# Patient Record
Sex: Female | Born: 1950
Health system: Southern US, Community
[De-identification: ages and names within clinical notes are randomized; demographics above are authoritative.]

## PROBLEM LIST (undated history)

## (undated) DIAGNOSIS — Z8619 Personal history of other infectious and parasitic diseases: Secondary | ICD-10-CM

## (undated) DIAGNOSIS — E079 Disorder of thyroid, unspecified: Secondary | ICD-10-CM

## (undated) DIAGNOSIS — K219 Gastro-esophageal reflux disease without esophagitis: Secondary | ICD-10-CM

## (undated) DIAGNOSIS — G43909 Migraine, unspecified, not intractable, without status migrainosus: Secondary | ICD-10-CM

## (undated) DIAGNOSIS — N809 Endometriosis, unspecified: Secondary | ICD-10-CM

## (undated) HISTORY — DX: Personal history of other infectious and parasitic diseases: Z86.19

## (undated) HISTORY — DX: Gastro-esophageal reflux disease without esophagitis: K21.9

## (undated) HISTORY — DX: Endometriosis, unspecified: N80.9

## (undated) HISTORY — PX: TONSILLECTOMY: SUR1361

## (undated) HISTORY — PX: OTHER SURGICAL HISTORY: SHX169

## (undated) HISTORY — PX: ABDOMINAL HYSTERECTOMY: SHX81

## (undated) HISTORY — DX: Disorder of thyroid, unspecified: E07.9

## (undated) HISTORY — DX: Migraine, unspecified, not intractable, without status migrainosus: G43.909

---

## 2016-01-07 MED FILL — DULoxetine HCL 60 MG CPEP: 60 | 90 days supply | Qty: 180 | Fill #0

## 2016-02-27 MED FILL — TOPIRAMATE 100 MG TABLET: 100 | 90 days supply | Qty: 90 | Fill #0

## 2016-03-01 MED FILL — ESTROGEN-METHYLTESTOS F.S.: 1.25-2.5 | 90 days supply | Qty: 90 | Fill #0

## 2016-03-01 MED FILL — BUTALB-ACETAMIN-CAFF 50-325: 50-325-40 | 15 days supply | Qty: 180 | Fill #0

## 2016-03-02 DIAGNOSIS — Z0000 Encounter for general adult medical examination without abnormal findings: Secondary | ICD-10-CM | POA: Diagnosis not present

## 2016-03-02 DIAGNOSIS — E559 Vitamin D deficiency, unspecified: Secondary | ICD-10-CM | POA: Diagnosis not present

## 2016-03-02 DIAGNOSIS — F419 Anxiety disorder, unspecified: Secondary | ICD-10-CM | POA: Diagnosis not present

## 2016-03-02 DIAGNOSIS — N951 Menopausal and female climacteric states: Secondary | ICD-10-CM | POA: Diagnosis not present

## 2016-03-02 DIAGNOSIS — K219 Gastro-esophageal reflux disease without esophagitis: Secondary | ICD-10-CM | POA: Diagnosis not present

## 2016-03-02 DIAGNOSIS — G4700 Insomnia, unspecified: Secondary | ICD-10-CM | POA: Diagnosis not present

## 2016-03-02 DIAGNOSIS — E039 Hypothyroidism, unspecified: Secondary | ICD-10-CM | POA: Diagnosis not present

## 2016-03-02 DIAGNOSIS — E785 Hyperlipidemia, unspecified: Secondary | ICD-10-CM | POA: Diagnosis not present

## 2016-03-02 DIAGNOSIS — G43909 Migraine, unspecified, not intractable, without status migrainosus: Secondary | ICD-10-CM | POA: Diagnosis not present

## 2016-03-05 MED FILL — PHENTERMINE 37.5 MG TABLET: 37.5 | 30 days supply | Qty: 30 | Fill #0

## 2016-03-05 MED FILL — NAPROXEN 500 MG TABLET: 500 | 90 days supply | Qty: 180 | Fill #0

## 2016-03-05 MED FILL — SYNTHROID 50 MCG TABLET: 50 | 90 days supply | Qty: 90 | Fill #0

## 2016-03-05 MED FILL — CLINDAMYCIN PH 1% GEL: 1 | 30 days supply | Qty: 60 | Fill #0

## 2016-03-05 MED FILL — traMADol HCL 50 MG TABS: 50 | 20 days supply | Qty: 60 | Fill #0

## 2016-04-05 MED FILL — PHENTERMINE 37.5 MG TABLET: 37.5 | 30 days supply | Qty: 30 | Fill #1

## 2016-04-05 MED FILL — DULoxetine HCL 60 MG CPEP: 60 | 90 days supply | Qty: 180 | Fill #0

## 2016-05-11 MED FILL — ESTROGEN-METHYLTESTOS F.S.: 1.25-2.5 | 90 days supply | Qty: 90 | Fill #1

## 2016-05-11 MED FILL — PHENTERMINE 37.5 MG TABLET: 37.5 | 30 days supply | Qty: 30 | Fill #2

## 2016-05-17 MED FILL — TOPIRAMATE 100 MG TABLET: 100 | 90 days supply | Qty: 90 | Fill #1

## 2016-07-02 MED FILL — traMADol HCL 50 MG TABS: 50 | 20 days supply | Qty: 60 | Fill #1

## 2016-07-02 MED FILL — PHENTERMINE 37.5 MG TABLET: 37.5 | 30 days supply | Qty: 30 | Fill #3

## 2016-07-09 MED FILL — DULoxetine HCL 60 MG CPEP: 60 | 90 days supply | Qty: 180 | Fill #1

## 2016-07-09 MED FILL — NAPROXEN 500 MG TABLET: 500 | 90 days supply | Qty: 180 | Fill #1

## 2016-07-21 ENCOUNTER — Other Ambulatory Visit (HOSPITAL_COMMUNITY): Payer: Self-pay | Admitting: Internal Medicine

## 2016-07-21 DIAGNOSIS — G43009 Migraine without aura, not intractable, without status migrainosus: Secondary | ICD-10-CM

## 2016-07-23 ENCOUNTER — Ambulatory Visit (HOSPITAL_COMMUNITY)
Admission: RE | Admit: 2016-07-23 | Discharge: 2016-07-23 | Disposition: A | Discharge status: Home / Self Care | Payer: 59 | Source: Ambulatory Visit | Attending: Internal Medicine | Admitting: Internal Medicine

## 2016-07-23 DIAGNOSIS — G43009 Migraine without aura, not intractable, without status migrainosus: Secondary | ICD-10-CM | POA: Insufficient documentation

## 2016-07-23 DIAGNOSIS — G43909 Migraine, unspecified, not intractable, without status migrainosus: Secondary | ICD-10-CM | POA: Diagnosis not present

## 2016-08-30 MED FILL — TOPIRAMATE 100 MG TABLET: 100 | 90 days supply | Qty: 90 | Fill #2

## 2016-09-08 MED FILL — ESTROGEN-METHYLTESTOS F.S.: 1.25-2.5 | 90 days supply | Qty: 90 | Fill #0

## 2016-09-24 MED FILL — SYNTHROID 50 MCG TABLET: 50 | 90 days supply | Qty: 90 | Fill #1

## 2016-09-27 MED FILL — PHENTERMINE 37.5 MG TABLET: 37.5 | 30 days supply | Qty: 30 | Fill #0

## 2016-11-09 DIAGNOSIS — S61452A Open bite of left hand, initial encounter: Secondary | ICD-10-CM | POA: Diagnosis not present

## 2016-11-16 MED FILL — PHENTERMINE 37.5 MG TABLET: 37.5 | 30 days supply | Qty: 30 | Fill #1

## 2016-12-02 MED FILL — DULoxetine HCL 60 MG CPEP: 60 | 90 days supply | Qty: 180 | Fill #2

## 2016-12-24 MED FILL — TOPIRAMATE 100 MG TABLET: 100 | 90 days supply | Qty: 90 | Fill #3

## 2016-12-24 MED FILL — PHENTERMINE 37.5 MG TABLET: 37.5 | 30 days supply | Qty: 30 | Fill #2

## 2016-12-24 MED FILL — ESTROGEN-METHYLTESTOS F.S.: 1.25-2.5 | 90 days supply | Qty: 90 | Fill #1

## 2017-01-12 DIAGNOSIS — L570 Actinic keratosis: Secondary | ICD-10-CM | POA: Diagnosis not present

## 2017-01-12 DIAGNOSIS — D485 Neoplasm of uncertain behavior of skin: Secondary | ICD-10-CM | POA: Diagnosis not present

## 2017-01-20 DIAGNOSIS — Z1231 Encounter for screening mammogram for malignant neoplasm of breast: Secondary | ICD-10-CM | POA: Diagnosis not present

## 2017-01-25 DIAGNOSIS — L820 Inflamed seborrheic keratosis: Secondary | ICD-10-CM | POA: Diagnosis not present

## 2017-01-25 DIAGNOSIS — L570 Actinic keratosis: Secondary | ICD-10-CM | POA: Diagnosis not present

## 2017-03-10 DIAGNOSIS — J019 Acute sinusitis, unspecified: Secondary | ICD-10-CM | POA: Diagnosis not present

## 2017-03-10 DIAGNOSIS — E039 Hypothyroidism, unspecified: Secondary | ICD-10-CM | POA: Diagnosis not present

## 2017-03-10 DIAGNOSIS — E785 Hyperlipidemia, unspecified: Secondary | ICD-10-CM | POA: Diagnosis not present

## 2017-03-10 DIAGNOSIS — F419 Anxiety disorder, unspecified: Secondary | ICD-10-CM | POA: Diagnosis not present

## 2017-03-10 DIAGNOSIS — E559 Vitamin D deficiency, unspecified: Secondary | ICD-10-CM | POA: Diagnosis not present

## 2017-03-10 DIAGNOSIS — R030 Elevated blood-pressure reading, without diagnosis of hypertension: Secondary | ICD-10-CM | POA: Diagnosis not present

## 2017-03-10 MED FILL — AZITHROMYCIN 250 MG TABLET: 250 | 5 days supply | Qty: 6 | Fill #0

## 2017-03-10 MED FILL — CYCLOBENZAPRINE 5 MG TABLET: 5 | 20 days supply | Qty: 60 | Fill #0

## 2017-03-11 MED FILL — traMADol HCL 50 MG TABS: 50 | 20 days supply | Qty: 60 | Fill #0

## 2017-03-28 MED FILL — PHENTERMINE 37.5 MG TABLET: 37.5 | 30 days supply | Qty: 30 | Fill #0 | Status: TO

## 2017-03-28 MED FILL — TROKENDI XR 100 MG CAPSULE: 100 | 30 days supply | Qty: 30 | Fill #0 | Status: TO

## 2017-04-13 DIAGNOSIS — K219 Gastro-esophageal reflux disease without esophagitis: Secondary | ICD-10-CM | POA: Diagnosis not present

## 2017-04-13 DIAGNOSIS — Z1211 Encounter for screening for malignant neoplasm of colon: Secondary | ICD-10-CM | POA: Diagnosis not present

## 2017-04-13 DIAGNOSIS — Z1212 Encounter for screening for malignant neoplasm of rectum: Secondary | ICD-10-CM | POA: Diagnosis not present

## 2017-04-13 MED FILL — SYNTHROID 50 MCG TABLET: 50 | 90 days supply | Qty: 90 | Fill #0

## 2017-04-13 MED FILL — LANSOPRAZOLE DR 30 MG CAP: 30 | 30 days supply | Qty: 60 | Fill #0

## 2017-04-14 MED FILL — ESTROGEN-METHYLTESTOS F.S.: 1.25-2.5 | 90 days supply | Qty: 90 | Fill #0

## 2017-05-09 MED FILL — PHENTERMINE 37.5 MG TABLET: 37.5 | 30 days supply | Qty: 30 | Fill #0

## 2017-05-09 MED FILL — TROKENDI XR 100 MG CAPSULE: 100 | 30 days supply | Qty: 30 | Fill #0

## 2017-05-11 MED FILL — SUPREP BOWEL PREP KIT: 17.5-3.13-1 | 1 days supply | Qty: 354 | Fill #0

## 2017-05-17 DIAGNOSIS — Z8371 Family history of colonic polyps: Secondary | ICD-10-CM | POA: Diagnosis not present

## 2017-05-17 DIAGNOSIS — D120 Benign neoplasm of cecum: Secondary | ICD-10-CM | POA: Diagnosis not present

## 2017-05-17 DIAGNOSIS — Z1211 Encounter for screening for malignant neoplasm of colon: Secondary | ICD-10-CM | POA: Diagnosis not present

## 2017-05-17 LAB — HM COLONOSCOPY

## 2017-06-09 MED FILL — TROKENDI XR 100 MG CAPSULE: 100 | 30 days supply | Qty: 30 | Fill #1

## 2017-06-09 MED FILL — LANSOPRAZOLE DR 30 MG CAP: 30 | 30 days supply | Qty: 60 | Fill #1

## 2017-06-09 MED FILL — PHENTERMINE 37.5 MG TABLET: 37.5 | 30 days supply | Qty: 30 | Fill #1

## 2017-06-30 MED FILL — DULoxetine HCL 60 MG CPEP: 60 | 90 days supply | Qty: 180 | Fill #0

## 2017-07-29 MED FILL — PHENTERMINE 37.5 MG TABLET: 37.5 | 30 days supply | Qty: 30 | Fill #2

## 2017-07-29 MED FILL — ESTROGEN-METHYLTESTOS F.S.: 1.25-2.5 | 90 days supply | Qty: 90 | Fill #1

## 2017-07-29 MED FILL — TROKENDI XR 100 MG CAPSULE: 100 | 30 days supply | Qty: 30 | Fill #2

## 2017-08-12 MED FILL — LANSOPRAZOLE DR 30 MG CAP: 30 | 30 days supply | Qty: 60 | Fill #2 | Status: TO

## 2017-08-24 DIAGNOSIS — Z0000 Encounter for general adult medical examination without abnormal findings: Secondary | ICD-10-CM | POA: Diagnosis not present

## 2017-08-24 MED FILL — ONDANSETRON ODT 4 MG TABLET: 4 | 20 days supply | Qty: 40 | Fill #0

## 2017-08-24 MED FILL — TROKENDI XR 100 MG CAPSULE: 100 | 90 days supply | Qty: 90 | Fill #0

## 2017-08-24 MED FILL — MONTELUKAST SOD 10 MG TAB: 10 | 90 days supply | Qty: 90 | Fill #0

## 2017-08-25 MED FILL — traMADol HCL 50 MG TABS: 50 | 30 days supply | Qty: 90 | Fill #0

## 2017-08-26 MED FILL — SYNTHROID 50 MCG TABLET: 50 | 90 days supply | Qty: 90 | Fill #0

## 2017-09-05 MED FILL — LANSOPRAZOLE DR 30 MG CAP: 30 | 90 days supply | Qty: 180 | Fill #0

## 2017-10-26 MED FILL — PHENTERMINE 37.5 MG TABLET: 37.5 | 30 days supply | Qty: 30 | Fill #0

## 2017-10-26 MED FILL — SHIPPING COST: 1 days supply | Qty: 1 | Fill #0

## 2017-11-29 ENCOUNTER — Other Ambulatory Visit (INDEPENDENT_AMBULATORY_CARE_PROVIDER_SITE_OTHER): Payer: 59

## 2017-11-29 ENCOUNTER — Ambulatory Visit: Payer: 59 | Admitting: Nurse Practitioner

## 2017-11-29 ENCOUNTER — Encounter: Payer: Self-pay | Admitting: Nurse Practitioner

## 2017-11-29 VITALS — BP 138/82 | HR 67 | Temp 98.0°F | Resp 16 | Ht 63.75 in | Wt 139.8 lb

## 2017-11-29 DIAGNOSIS — F419 Anxiety disorder, unspecified: Secondary | ICD-10-CM

## 2017-11-29 DIAGNOSIS — R110 Nausea: Secondary | ICD-10-CM

## 2017-11-29 DIAGNOSIS — M25519 Pain in unspecified shoulder: Secondary | ICD-10-CM

## 2017-11-29 DIAGNOSIS — K219 Gastro-esophageal reflux disease without esophagitis: Secondary | ICD-10-CM

## 2017-11-29 DIAGNOSIS — G43809 Other migraine, not intractable, without status migrainosus: Secondary | ICD-10-CM | POA: Diagnosis not present

## 2017-11-29 DIAGNOSIS — H524 Presbyopia: Secondary | ICD-10-CM | POA: Diagnosis not present

## 2017-11-29 DIAGNOSIS — E039 Hypothyroidism, unspecified: Secondary | ICD-10-CM

## 2017-11-29 DIAGNOSIS — Z780 Asymptomatic menopausal state: Secondary | ICD-10-CM | POA: Diagnosis not present

## 2017-11-29 DIAGNOSIS — G8929 Other chronic pain: Secondary | ICD-10-CM | POA: Diagnosis not present

## 2017-11-29 LAB — COMPREHENSIVE METABOLIC PANEL
ALT: 22 U/L (ref 0–35)
AST: 25 U/L (ref 0–37)
Albumin: 4.1 g/dL (ref 3.5–5.2)
Alkaline Phosphatase: 51 U/L (ref 39–117)
BUN: 17 mg/dL (ref 6–23)
CALCIUM: 9.5 mg/dL (ref 8.4–10.5)
CHLORIDE: 109 meq/L (ref 96–112)
CO2: 26 mEq/L (ref 19–32)
Creatinine, Ser: 0.75 mg/dL (ref 0.40–1.20)
GFR: 82.33 mL/min (ref >60.00)
Glucose, Bld: 92 mg/dL (ref 70–99)
POTASSIUM: 4.1 meq/L (ref 3.5–5.1)
Sodium: 140 mEq/L (ref 135–145)
Total Bilirubin: 0.4 mg/dL (ref 0.2–1.2)
Total Protein: 6.6 g/dL (ref 6.0–8.3)

## 2017-11-29 LAB — CBC
HEMATOCRIT: 41 % (ref 36.0–46.0)
HEMOGLOBIN: 13.7 g/dL (ref 12.0–15.0)
MCHC: 33.4 g/dL (ref 30.0–36.0)
MCV: 91.7 fl (ref 78.0–100.0)
PLATELETS: 187 10*3/uL (ref 150.0–400.0)
RBC: 4.47 Mil/uL (ref 3.87–5.11)
RDW: 14.1 % (ref 11.5–15.5)
WBC: 3.6 10*3/uL — ABNORMAL LOW (ref 4.0–10.5)

## 2017-11-29 LAB — VITAMIN B12: Vitamin B-12: 341 pg/mL (ref 211–911)

## 2017-11-29 LAB — LIPID PANEL
CHOL/HDL RATIO: 3
Cholesterol: 189 mg/dL (ref 0–200)
HDL: 57.7 mg/dL (ref >39.00)
LDL CALC: 125 mg/dL — AB (ref 0–99)
NONHDL: 130.86
TRIGLYCERIDES: 31 mg/dL (ref 0.0–149.0)
VLDL: 6.2 mg/dL (ref 0.0–40.0)

## 2017-11-29 LAB — TSH: TSH: 1.65 u[IU]/mL (ref 0.35–4.50)

## 2017-11-29 LAB — MAGNESIUM: Magnesium: 2.2 mg/dL (ref 1.5–2.5)

## 2017-11-29 LAB — HEMOGLOBIN A1C: HEMOGLOBIN A1C: 5.9 % (ref 4.6–6.5)

## 2017-11-29 NOTE — Patient Instructions (Addendum)
Please head downstairs for lab work/x-rays. If any of your test results are critically abnormal, you will be contacted right away. Your results may be released to your MyChart for viewing before I am able to provide you with my response. I will contact you within a week about your test results and any recommendations for abnormalities.  I have placed a referral to neurology . Our office will call you to schedule this appointment. You should hear from our office in 7-10 days.  Please return in about 1 months for annual physical.

## 2017-11-29 NOTE — Progress Notes (Signed)
Name: Brooke Stuart   MRN: [id number]    DOB: 1950-07-12   Date:11/30/2017       Progress Note  Subjective  Chief Complaint  Chief Complaint  Patient presents with  . Establish Care    nausea and migraines    HPI Brooke Stuart is coming in to establish care with me as a new patient to our practice today. She has been working with a PCP in [place], where she lives, but works in [place] so decided to transfer care here. She is requesting evaluation of dizziness, nausea, and migraines today. We will review her chronic conditions as well.  Migraines- Long history of migraines since 2005 Maintained on trokendi daily at bedtime and excedrin migraine, essential oils for acute migraine, which occurs about 3 times per month and does seem to be relieved with excedrin and essential oils. She often experiences nausea with migraines, but she has also noticed feeling nauseated and dizzy, "feeling off balance" when she is not having a migraine headache. These symptoms have been occurring intermittently for years now She was given zofran prn by prior PCP which helps her nausea. She denies fevers, syncope, weakness, confusion, chest pain, abdominal pain, vomiting, shortness of breath, bowel or bladder changes. She originally saw neurology for evaluation in 2005 when she first started having the migraines, and has had an MRI every 4-5 years since, last MRI was 07/23/2016 Available to view in [place] was normal for age.  Menopausal symptoms- Maintained on estratest for years for vasomotor symptoms associated with menopause- tried to stop taking in 2014 but had continued symptoms and had to resume medication daily Denies pelvic pain, vaginal bleeding S/p complete hysterectomy Mammogram is overdue, she has not called to schedule  Anxiety- maintained on cymbalta 60 BID Reports good control of anxiety on current dosage of cymbalta and would like to continue this medication Denies depression, thoughts of  hurting herself or others  GERD- Maintained on prevacid 30 bid with good control of symptoms and no noted adverse effects Reports breakthrough symptoms- acid reflux, heartburn, if she misses doses  Hypothryoidism-maintained on synthroid 50 daily Reports daily medication compliance with no noted adverse effects Denies fatigue, weight gain, decreased appetite.  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 Menopause 11/30/2017  . Gastroesophageal reflux disease 11/30/2017  . Hypothyroidism 11/30/2017  . Migraine 11/30/2017  . Anxiety 11/30/2017  . Chronic shoulder pain 11/30/2017    Past Surgical History:  Procedure Laterality Date  . ABDOMINAL HYSTERECTOMY      Family History  Problem Relation Age of Onset  . Arthritis Mother   . Diabetes Mother   . Hearing loss Mother   . Heart disease Mother   . Stroke Father   . Alcohol abuse Father   . Hypertension Brother     Social History   Socioeconomic History  . Marital status: Married    Spouse name: Not on file  . Number of children: Not on file  . Years of education: Not on file  . Highest education level: Not on file  Occupational History  . Not  on file  Social Needs  . Financial resource strain: Not on file  . Food insecurity:    Worry: Not on file    Inability: Not on file  . Transportation needs:    Medical: Not on file    Non-medical: Not on file  Tobacco Use  . Smoking status: Never Smoker  . Smokeless tobacco: Never Used  Substance and Sexual Activity  . Alcohol use: Not Currently  . Drug use: Not  Currently  . Sexual activity: Yes  Lifestyle  . Physical activity:    Days per week: Not on file    Minutes per session: Not on file  . Stress: Not on file  Relationships  . Social connections:    Talks on phone: Not on file    Gets together: Not on file    Attends religious service: Not on file    Active member of club or organization: Not on file    Attends meetings of clubs or organizations: Not on file    Relationship status: Not on file  . Intimate partner violence:    Fear of current or ex partner: Not on file    Emotionally abused: Not on file    Physically abused: Not on file    Forced sexual activity: Not on file  Other Topics Concern  . Not on file  Social History Narrative  . Not on file     Current Outpatient Medications:  .  DULoxetine (CYMBALTA) 60 MG capsule, Take 60 mg by mouth 2 (two) times daily., Disp: , Rfl:  .  EST ESTROGENS-METHYLTEST DS 1.25-2.5 MG TABS, Take by mouth., Disp: , Rfl:  .  lansoprazole (PREVACID) 30 MG capsule, Take 30 mg by mouth 2 (two) times daily before a meal., Disp: , Rfl:  .  levothyroxine (SYNTHROID, LEVOTHROID) 50 MCG tablet, Take 50 mcg by mouth daily before breakfast., Disp: , Rfl:  .  montelukast (SINGULAIR) 10 MG tablet, Take 10 mg by mouth at bedtime., Disp: , Rfl:  .  naproxen (NAPROSYN) 500 MG tablet, Take 500 mg by mouth as needed., Disp: , Rfl:  .  Topiramate ER (TROKENDI XR) 100 MG CP24, Take by mouth., Disp: , Rfl:  .  traMADol (ULTRAM) 50 MG tablet, Take 50 mg by mouth as needed., Disp: , Rfl:  .  TURMERIC PO, Take 1,000 mg by mouth., Disp: , Rfl:   No Known Allergies   ROS See HPI  Objective  Vitals:   11/29/17 0852  BP: 138/82  Pulse: 67  Resp: 16  Temp: 98 F (36.7 C)  TempSrc: Oral  SpO2: 97%  Weight: 139 lb 12.8 oz (63.4 kg)  Height: 5' 3.75" (1.619 m)    Body mass index is 24.19 kg/m.  Physical Exam Vital signs reviewed. Constitutional: Patient appears well-developed and well-nourished. No  distress.  HENT: Head: Normocephalic and atraumatic.  Nose: Nose normal. Mouth/Throat: Oropharynx is clear and moist. No oropharyngeal exudate.  Eyes: Conjunctivae and EOM are normal. Pupils are equal, round, and reactive to light. No scleral icterus.  Neck: Normal range of motion. Neck supple. No thyromegaly present. No cervical adenopathy. Cardiovascular: Normal rate, regular rhythm and normal heart sounds.  No murmur heard. No BLE edema. Distal pulses intact. Pulmonary/Chest: Effort normal and breath sounds normal. No respiratory distress. Abdominal: Soft, no distension. Musculoskeletal: Normal range of motion. No gross deformities Neurological: She is alert and oriented to person, place, and time. No cranial nerve deficit. Coordination, balance, strength, speech and gait are  normal.  Skin: Skin is warm and dry. No rash noted. No erythema.  Psychiatric: Patient has a normal mood and affect. behavior is normal. Judgment and thought content normal.  Assessment & Plan RTC in 1 month for CPE  Nausea Likely related to migraines, referral to neurology has been made Update labs today F/u for new, worsening symptoms or if nausea persists after neurology evaluation, could consider adjusting GERD treatment if nausea remains - CBC; Future - Comprehensive metabolic panel; Future - Vitamin B12; Future - TSH; Future - Hemoglobin A1c; Future - Ambulatory referral to Neurology

## 2017-11-30 ENCOUNTER — Encounter: Payer: Self-pay | Admitting: Nurse Practitioner

## 2017-11-30 DIAGNOSIS — G8929 Other chronic pain: Secondary | ICD-10-CM | POA: Insufficient documentation

## 2017-11-30 DIAGNOSIS — E039 Hypothyroidism, unspecified: Secondary | ICD-10-CM | POA: Insufficient documentation

## 2017-11-30 DIAGNOSIS — Z780 Asymptomatic menopausal state: Secondary | ICD-10-CM | POA: Insufficient documentation

## 2017-11-30 DIAGNOSIS — R110 Nausea: Secondary | ICD-10-CM | POA: Insufficient documentation

## 2017-11-30 DIAGNOSIS — M25519 Pain in unspecified shoulder: Secondary | ICD-10-CM

## 2017-11-30 DIAGNOSIS — F419 Anxiety disorder, unspecified: Secondary | ICD-10-CM | POA: Insufficient documentation

## 2017-11-30 DIAGNOSIS — G43909 Migraine, unspecified, not intractable, without status migrainosus: Secondary | ICD-10-CM | POA: Insufficient documentation

## 2017-11-30 DIAGNOSIS — K219 Gastro-esophageal reflux disease without esophagitis: Secondary | ICD-10-CM | POA: Insufficient documentation

## 2017-11-30 NOTE — Assessment & Plan Note (Signed)
Stable Continue current meds on prn basis only Follow up for new, worsening symptoms - traMADol (ULTRAM) 50 MG tablet; Take 50 mg by mouth as needed. - naproxen (NAPROSYN) 500 MG tablet; Take 500 mg by mouth as needed.

## 2017-11-30 NOTE — Assessment & Plan Note (Signed)
Continue current medications Update labs - Lipid panel; Future - TSH; Future

## 2017-11-30 NOTE — Assessment & Plan Note (Signed)
Continue current medications We discussed referral to neurology for further evaluation and management and to consider newer treatments available for migraines and she is agreeable Update labs today - CBC; Future - Comprehensive metabolic panel; Future - Vitamin B12; Future - Hemoglobin A1c; Future - Ambulatory referral to Neurology - Topiramate ER (TROKENDI XR) 100 MG CP24; Take by mouth.

## 2017-11-30 NOTE — Assessment & Plan Note (Signed)
Stable Continue cymbalta Update labs F/u for new, worsening symptoms - DULoxetine (CYMBALTA) 60 MG capsule; Take 60 mg by mouth 2 (two) times daily. - Comprehensive metabolic panel; Future

## 2017-11-30 NOTE — Assessment & Plan Note (Addendum)
Stable Continue current medication Update labs F/U for new, worsening symptoms - Magnesium; Future - lansoprazole (PREVACID) 30 MG capsule; Take 30 mg by mouth 2 (two) times daily before a meal.

## 2017-11-30 NOTE — Assessment & Plan Note (Signed)
Strongly encouraged to update mammogram, she says she wants to have her mammogram at [facility] facility and she will call them to schedule Update labs Will consider weaning or decreasing dosage of estratest at next OV - EST ESTROGENS-METHYLTEST DS 1.25-2.5 MG TABS; Take by mouth. - Comprehensive metabolic panel; Future - TSH; Future - Lipid panel; Future

## 2017-12-27 ENCOUNTER — Ambulatory Visit (INDEPENDENT_AMBULATORY_CARE_PROVIDER_SITE_OTHER): Payer: 59 | Admitting: Nurse Practitioner

## 2017-12-27 ENCOUNTER — Telehealth: Payer: Self-pay

## 2017-12-27 ENCOUNTER — Encounter: Payer: Self-pay | Admitting: Nurse Practitioner

## 2017-12-27 ENCOUNTER — Ambulatory Visit (INDEPENDENT_AMBULATORY_CARE_PROVIDER_SITE_OTHER)
Admission: RE | Admit: 2017-12-27 | Discharge: 2017-12-27 | Disposition: A | Discharge status: Home / Self Care | Payer: 59 | Source: Ambulatory Visit | Attending: Nurse Practitioner | Admitting: Nurse Practitioner

## 2017-12-27 ENCOUNTER — Other Ambulatory Visit: Payer: 59

## 2017-12-27 VITALS — BP 130/84 | HR 61 | Temp 98.4°F | Resp 16 | Ht 63.75 in | Wt 140.0 lb

## 2017-12-27 DIAGNOSIS — F419 Anxiety disorder, unspecified: Secondary | ICD-10-CM | POA: Diagnosis not present

## 2017-12-27 DIAGNOSIS — Z13820 Encounter for screening for osteoporosis: Secondary | ICD-10-CM

## 2017-12-27 DIAGNOSIS — Z780 Asymptomatic menopausal state: Secondary | ICD-10-CM

## 2017-12-27 DIAGNOSIS — Z1159 Encounter for screening for other viral diseases: Secondary | ICD-10-CM

## 2017-12-27 DIAGNOSIS — Z9189 Other specified personal risk factors, not elsewhere classified: Secondary | ICD-10-CM

## 2017-12-27 DIAGNOSIS — IMO0002 Reserved for concepts with insufficient information to code with codable children: Secondary | ICD-10-CM

## 2017-12-27 DIAGNOSIS — Z0001 Encounter for general adult medical examination with abnormal findings: Secondary | ICD-10-CM

## 2017-12-27 DIAGNOSIS — Z114 Encounter for screening for human immunodeficiency virus [HIV]: Secondary | ICD-10-CM

## 2017-12-27 MED FILL — PREDNISOLONE AC 1% EYE DROP: 1 | 30 days supply | Qty: 10 | Fill #0

## 2017-12-27 MED FILL — MOXIFLOXACIN HCL 0.5 % SOLN: 0.5 | 15 days supply | Qty: 3 | Fill #0

## 2017-12-27 MED FILL — NEO/POLY/DEXAMET EYE OINT: 3.5-10000-0 | 14 days supply | Qty: 4 | Fill #0

## 2017-12-27 NOTE — Patient Instructions (Signed)
Please head downstairs for lab work If any of your test results are critically abnormal, you will be contacted right away. Otherwise, I will contact you within a week about your test results and any recommendations for abnormalities.  Please schedule DEXA scan at the front desk on your way out.  I have placed a referral to podiatry. Our office will begin processing this referral. Please follow up if you have not heard anything about this referral within 10 days.   Please return in about 6 months for follow up of thyroid and prediabetes.  Health Maintenance, Female Adopting a healthy lifestyle and getting preventive care can go a long way to promote health and wellness. Talk with your health care provider about what schedule of regular examinations is right for you. This is a good chance for you to check in with your provider about disease prevention and staying healthy. In between checkups, there are plenty of things you can do on your own. Experts have done a lot of research about which lifestyle changes and preventive measures are most likely to keep you healthy. Ask your health care provider for more information. Weight and diet Eat a healthy diet  Be sure to include plenty of vegetables, fruits, low-fat dairy products, and lean protein.  Do not eat a lot of foods high in solid fats, added sugars, or salt.  Get regular exercise. This is one of the most important things you can do for your health. ? Most adults should exercise for at least 150 minutes each week. The exercise should increase your heart rate and make you sweat (moderate-intensity exercise). ? Most adults should also do strengthening exercises at least twice a week. This is in addition to the moderate-intensity exercise.  Maintain a healthy weight  Body mass index (BMI) is a measurement that can be used to identify possible weight problems. It estimates body fat based on height and weight. Your health care provider can help  determine your BMI and help you achieve or maintain a healthy weight.  For females 79 years of age and older: ? A BMI below 18.5 is considered underweight. ? A BMI of 18.5 to 24.9 is normal. ? A BMI of 25 to 29.9 is considered overweight. ? A BMI of 30 and above is considered obese.  Watch levels of cholesterol and blood lipids  You should start having your blood tested for lipids and cholesterol at 65 years of age, then have this test every 5 years.  You may need to have your cholesterol levels checked more often if: ? Your lipid or cholesterol levels are high. ? You are older than 65 years of age. ? You are at high risk for heart disease.  Cancer screening Lung Cancer  Lung cancer screening is recommended for adults 28-46 years old who are at high risk for lung cancer because of a history of smoking.  A yearly low-dose CT scan of the lungs is recommended for people who: ? Currently smoke. ? Have quit within the past 15 years. ? Have at least a 30-pack-year history of smoking. A pack year is smoking an average of one pack of cigarettes a day for 1 year.  Yearly screening should continue until it has been 15 years since you quit.  Yearly screening should stop if you develop a health problem that would prevent you from having lung cancer treatment.  Breast Cancer  Practice breast self-awareness. This means understanding how your breasts normally appear and feel.  It also  means doing regular breast self-exams. Let your health care provider know about any changes, no matter how small.  If you are in your 20s or 30s, you should have a clinical breast exam (CBE) by a health care provider every 1-3 years as part of a regular health exam.  If you are 55 or older, have a CBE every year. Also consider having a breast X-ray (mammogram) every year.  If you have a family history of breast cancer, talk to your health care provider about genetic screening.  If you are at high risk for  breast cancer, talk to your health care provider about having an MRI and a mammogram every year.  Breast cancer gene (BRCA) assessment is recommended for women who have family members with BRCA-related cancers. BRCA-related cancers include: ? Breast. ? Ovarian. ? Tubal. ? Peritoneal cancers.  Results of the assessment will determine the need for genetic counseling and BRCA1 and BRCA2 testing.  Cervical Cancer Your health care provider may recommend that you be screened regularly for cancer of the pelvic organs (ovaries, uterus, and vagina). This screening involves a pelvic examination, including checking for microscopic changes to the surface of your cervix (Pap test). You may be encouraged to have this screening done every 3 years, beginning at age 30.  For women ages 59-65, health care providers may recommend pelvic exams and Pap testing every 3 years, or they may recommend the Pap and pelvic exam, combined with testing for human papilloma virus (HPV), every 5 years. Some types of HPV increase your risk of cervical cancer. Testing for HPV may also be done on women of any age with unclear Pap test results.  Other health care providers may not recommend any screening for nonpregnant women who are considered low risk for pelvic cancer and who do not have symptoms. Ask your health care provider if a screening pelvic exam is right for you.  If you have had past treatment for cervical cancer or a condition that could lead to cancer, you need Pap tests and screening for cancer for at least 20 years after your treatment. If Pap tests have been discontinued, your risk factors (such as having a new sexual partner) need to be reassessed to determine if screening should resume. Some women have medical problems that increase the chance of getting cervical cancer. In these cases, your health care provider may recommend more frequent screening and Pap tests.  Colorectal Cancer  This type of cancer can be  detected and often prevented.  Routine colorectal cancer screening usually begins at 65 years of age and continues through 65 years of age.  Your health care provider may recommend screening at an earlier age if you have risk factors for colon cancer.  Your health care provider may also recommend using home test kits to check for hidden blood in the stool.  A small camera at the end of a tube can be used to examine your colon directly (sigmoidoscopy or colonoscopy). This is done to check for the earliest forms of colorectal cancer.  Routine screening usually begins at age 64.  Direct examination of the colon should be repeated every 5-10 years through 65 years of age. However, you may need to be screened more often if early forms of precancerous polyps or small growths are found.  Skin Cancer  Check your skin from head to toe regularly.  Tell your health care provider about any new moles or changes in moles, especially if there is a change in  a mole's shape or color.  Also tell your health care provider if you have a mole that is larger than the size of a pencil eraser.  Always use sunscreen. Apply sunscreen liberally and repeatedly throughout the day.  Protect yourself by wearing long sleeves, pants, a wide-brimmed hat, and sunglasses whenever you are outside.  Heart disease, diabetes, and high blood pressure  High blood pressure causes heart disease and increases the risk of stroke. High blood pressure is more likely to develop in: ? People who have blood pressure in the high end of the normal range (130-139/85-89 mm Hg). ? People who are overweight or obese. ? People who are African American.  If you are 11-33 years of age, have your blood pressure checked every 3-5 years. If you are 8 years of age or older, have your blood pressure checked every year. You should have your blood pressure measured twice-once when you are at a hospital or clinic, and once when you are not at a  hospital or clinic. Record the average of the two measurements. To check your blood pressure when you are not at a hospital or clinic, you can use: ? An automated blood pressure machine at a pharmacy. ? A home blood pressure monitor.  If you are between 20 years and 87 years old, ask your health care provider if you should take aspirin to prevent strokes.  Have regular diabetes screenings. This involves taking a blood sample to check your fasting blood sugar level. ? If you are at a normal weight and have a low risk for diabetes, have this test once every three years after 65 years of age. ? If you are overweight and have a high risk for diabetes, consider being tested at a younger age or more often. Preventing infection Hepatitis B  If you have a higher risk for hepatitis B, you should be screened for this virus. You are considered at high risk for hepatitis B if: ? You were born in a country where hepatitis B is common. Ask your health care provider which countries are considered high risk. ? Your parents were born in a high-risk country, and you have not been immunized against hepatitis B (hepatitis B vaccine). ? You have HIV or AIDS. ? You use needles to inject street drugs. ? You live with someone who has hepatitis B. ? You have had sex with someone who has hepatitis B. ? You get hemodialysis treatment. ? You take certain medicines for conditions, including cancer, organ transplantation, and autoimmune conditions.  Hepatitis C  Blood testing is recommended for: ? Everyone born from [birth year] through 1963. ? Anyone with known risk factors for hepatitis C.  Sexually transmitted infections (STIs)  You should be screened for sexually transmitted infections (STIs) including gonorrhea and chlamydia if: ? You are sexually active and are younger than 65 years of age. ? You are older than 65 years of age and your health care provider tells you that you are at risk for this type of  infection. ? Your sexual activity has changed since you were last screened and you are at an increased risk for chlamydia or gonorrhea. Ask your health care provider if you are at risk.  If you do not have HIV, but are at risk, it may be recommended that you take a prescription medicine daily to prevent HIV infection. This is called pre-exposure prophylaxis (PrEP). You are considered at risk if: ? You are sexually active and do not regularly use condoms  or know the HIV status of your partner(s). ? You take drugs by injection. ? You are sexually active with a partner who has HIV.  Talk with your health care provider about whether you are at high risk of being infected with HIV. If you choose to begin PrEP, you should first be tested for HIV. You should then be tested every 3 months for as long as you are taking PrEP. Pregnancy  If you are premenopausal and you may become pregnant, ask your health care provider about preconception counseling.  If you may become pregnant, take 400 to 800 micrograms (mcg) of folic acid every day.  If you want to prevent pregnancy, talk to your health care provider about birth control (contraception). Osteoporosis and menopause  Osteoporosis is a disease in which the bones lose minerals and strength with aging. This can result in serious bone fractures. Your risk for osteoporosis can be identified using a bone density scan.  If you are 33 years of age or older, or if you are at risk for osteoporosis and fractures, ask your health care provider if you should be screened.  Ask your health care provider whether you should take a calcium or vitamin D supplement to lower your risk for osteoporosis.  Menopause may have certain physical symptoms and risks.  Hormone replacement therapy may reduce some of these symptoms and risks. Talk to your health care provider about whether hormone replacement therapy is right for you. Follow these instructions at home:  Schedule  regular health, dental, and eye exams.  Stay current with your immunizations.  Do not use any tobacco products including cigarettes, chewing tobacco, or electronic cigarettes.  If you are pregnant, do not drink alcohol.  If you are breastfeeding, limit how much and how often you drink alcohol.  Limit alcohol intake to no more than 1 drink per day for nonpregnant women. One drink equals 12 ounces of beer, 5 ounces of wine, or 1 ounces of hard liquor.  Do not use street drugs.  Do not share needles.  Ask your health care provider for help if you need support or information about quitting drugs.  Tell your health care provider if you often feel depressed.  Tell your health care provider if you have ever been abused or do not feel safe at home. This information is not intended to replace advice given to you by your health care provider. Make sure you discuss any questions you have with your health care provider. Document Released: 12/21/2010 Document Revised: 11/13/2015 Document Reviewed: 03/11/2015 Elsevier Interactive Patient Education  [employer].

## 2017-12-27 NOTE — Assessment & Plan Note (Signed)
Recommended trial of decreasing/weaning estratest due to long term risks of the medication and she does not want to try at this time  We will re-evaluate this at her next OV

## 2017-12-27 NOTE — Progress Notes (Signed)
Name: Brooke Stuart   MRN: [id number]    DOB: 06-05-1951   Date:12/27/2017       Progress Note  Subjective  Chief Complaint  Chief Complaint  Patient presents with  . CPE    fasting    HPI  Patient presents for annual CPE.  Diet, Exercise: watching her sugar intake, trying to cut sweets, planning to start yoga program for routine exercise, walks 5 miles a day at work  USPSTF grade A and B recommendations  Depression: maintained on cymbalta 60 BID for anxiety. Reports good control of anxiety on current dosage of cymbalta Denies depression, thoughts of hurting herself or others Depression screen [facility] 2/9 12/27/2017  Decreased Interest 0  Down, Depressed, Hopeless 0  PHQ - 2 Score 0  Altered sleeping 0  Tired, decreased energy 0  Change in appetite 0  Feeling bad or failure about yourself  0  Trouble concentrating 0  Moving slowly or fidgety/restless 0  Suicidal thoughts 0  PHQ-9 Score 0   Hypertension: BP Readings from Last 3 Encounters:  12/27/17 130/84  11/29/17 138/82   Obesity: Wt Readings from Last 3 Encounters:  12/27/17 140 lb (63.5 kg)  11/29/17 139 lb 12.8 oz (63.4 kg)   BMI Readings from Last 3 Encounters:  12/27/17 24.22 kg/m  11/29/17 24.19 kg/m    Alcohol: no Tobacco use: no, never  HIV,  hep C: HIV and hepatitis C screenings ordered today STD testing and prevention (chl/gon/syphilis): declines, no concerns  Intimate partner violence: denies, feels safe Menstrual HistoryAbnormal Bleeding: s/p hysterectomy, denies pelvic pain or vaginal bleeding, maintained on estratest for years for vasomotor symptoms with no reported adverse effects Incontinence Symptoms: denies   Vaccinations: declines PNA vaccination, otherwise up to date, added to wait list for shingrix  Advanced Care Planning: A voluntary discussion about advance care planning including the explanation and discussion of advance directives.  Discussed health care proxy and Living will, and the  patient was DOES NOT have a living will at present time. If patient does have living will, I have requested they bring this to the clinic to be scanned in to their chart.  Breast cancer: mammogram overdue, has been scheduled  Osteoporosis: DEXA ordered today  Lipids:  Lab Results  Component Value Date   CHOL 189 11/29/2017   Lab Results  Component Value Date   HDL 57.70 11/29/2017   Lab Results  Component Value Date   LDLCALC 125 (H) 11/29/2017   Lab Results  Component Value Date   TRIG 31.0 11/29/2017   Lab Results  Component Value Date   CHOLHDL 3 11/29/2017   No results found for: LDLDIRECT  Glucose:  Glucose, Bld  Date Value Ref Range Status  11/29/2017 92 70 - 99 mg/dL Final    Skin cancer: does not wear sunscreen, no concerning lesions or moles  Colorectal cancer: done at outside office  Aspirin: not indicated ECG: not indicated   Patient Active Problem List   Diagnosis Date Noted  . Menopause 11/30/2017  . Gastroesophageal reflux disease 11/30/2017  . Hypothyroidism 11/30/2017  . Migraine 11/30/2017  . Anxiety 11/30/2017  . Chronic shoulder pain 11/30/2017    Past Surgical History:  Procedure Laterality Date  . ABDOMINAL HYSTERECTOMY      Family History  Problem Relation Age of Onset  . Arthritis Mother   . Diabetes Mother   . Hearing loss Mother   . Heart disease Mother   . Stroke Father   . Alcohol abuse  Father   . Hypertension Brother     Social History   Socioeconomic History  . Marital status: Married    Spouse name: Not on file  . Number of children: Not on file  . Years of education: Not on file  . Highest education level: Not on file  Occupational History  . Not on file  Social Needs  . Financial resource strain: Not on file  . Food insecurity:    Worry: Not on file    Inability: Not on file  . Transportation needs:    Medical: Not on file    Non-medical: Not on file  Tobacco Use  . Smoking status: Never Smoker  .  Smokeless tobacco: Never Used  Substance and Sexual Activity  . Alcohol use: Not Currently  . Drug use: Not Currently  . Sexual activity: Yes  Lifestyle  . Physical activity:    Days per week: Not on file    Minutes per session: Not on file  . Stress: Not on file  Relationships  . Social connections:    Talks on phone: Not on file    Gets together: Not on file    Attends religious service: Not on file    Active member of club or organization: Not on file    Attends meetings of clubs or organizations: Not on file    Relationship status: Not on file  . Intimate partner violence:    Fear of current or ex partner: Not on file    Emotionally abused: Not on file    Physically abused: Not on file    Forced sexual activity: Not on file  Other Topics Concern  . Not on file  Social History Narrative  . Not on file     Current Outpatient Medications:  .  DULoxetine (CYMBALTA) 60 MG capsule, Take 60 mg by mouth 2 (two) times daily., Disp: , Rfl:  .  EST ESTROGENS-METHYLTEST DS 1.25-2.5 MG TABS, Take by mouth., Disp: , Rfl:  .  lansoprazole (PREVACID) 30 MG capsule, Take 30 mg by mouth 2 (two) times daily before a meal., Disp: , Rfl:  .  levothyroxine (SYNTHROID, LEVOTHROID) 50 MCG tablet, Take 50 mcg by mouth daily before breakfast., Disp: , Rfl:  .  montelukast (SINGULAIR) 10 MG tablet, Take 10 mg by mouth at bedtime., Disp: , Rfl:  .  naproxen (NAPROSYN) 500 MG tablet, Take 500 mg by mouth as needed., Disp: , Rfl:  .  Topiramate ER (TROKENDI XR) 100 MG CP24, Take by mouth., Disp: , Rfl:  .  traMADol (ULTRAM) 50 MG tablet, Take 50 mg by mouth as needed., Disp: , Rfl:  .  TURMERIC PO, Take 1,000 mg by mouth., Disp: , Rfl:   No Known Allergies   ROS  Constitutional: Negative for fever or weight change.  Respiratory: Negative for cough and shortness of breath.   Cardiovascular: Negative for chest pain or palpitations.  Gastrointestinal: Negative for abdominal pain, no bowel  changes.  Musculoskeletal: Negative for gait problem or joint swelling.  Skin: Negative for rash.  Neurological: Negative for dizziness or headache.  No other specific complaints in a complete review of systems (except as listed in HPI above).  Toenail problem- This is not a new problem She reports deformities to toenails of bilateral fifth toes for some time now, which she says are painful after wearing shoes all day She requests referral to podiatry for further evaluation of the toenails  Objective  Vitals:   12/27/17  0920  BP: 130/84  Pulse: 61  Resp: 16  Temp: 98.4 F (36.9 C)  TempSrc: Oral  SpO2: 97%  Weight: 140 lb (63.5 kg)  Height: 5' 3.75" (1.619 m)    Body mass index is 24.22 kg/m.  Physical Exam Vital signs reviewed. Constitutional: Patient appears well-developed and well-nourished. No distress.  HENT: Head: Normocephalic and atraumatic. Ears: B TMs ok, no erythema or effusion; Nose: Nose normal. Mouth/Throat: Oropharynx is clear and moist. No oropharyngeal exudate.  Eyes: Conjunctivae and EOM are normal. Pupils are equal, round, and reactive to light. No scleral icterus.  Neck: Normal range of motion. Neck supple. No cervical adenopathy. No thyromegaly present.  Cardiovascular: Normal rate, regular rhythm and normal heart sounds.  No murmur heard. No BLE edema. Distal pulses intact. Pulmonary/Chest: Effort normal and breath sounds normal. No respiratory distress. Abdominal: Soft. Bowel sounds are normal, no distension. There is no tenderness. no masses Breast: defd to Mammogram Musculoskeletal: Normal range of motion, No gross deformities Neurological: She is alert and oriented to person, place, and time. No cranial nerve deficit. Coordination, balance, strength, speech and gait are normal.  Skin: Skin is warm and dry. No rash noted. No erythema.  Psychiatric: Patient has a normal mood and affect. behavior is normal. Judgment and thought content  normal.   Fall Risk: Fall Risk  12/27/2017  Falls in the past year? No     Assessment & Plan RTC in about 5 months for F/U: hypothyroidism- recheck TSH, pre-diabetes- recheck A1c, menopausal symptoms-re-evaluated estratest  11/29/17 labs included CBC, CMET, lipid panel, B12, TSH, Magnesium WNL AND A1c indicating prediabetes  Toe problem Referral to podiatry placed - Ambulatory referral to Podiatry

## 2017-12-27 NOTE — Assessment & Plan Note (Signed)
Stable Continue current medication F/U for new, worsening symptoms

## 2017-12-27 NOTE — Telephone Encounter (Signed)
-----   Message from Lorrin Jackson, CMA sent at 12/27/2017 10:00 AM EDT ----- Can you add patient on to the shingles wait list please.   Thank you!

## 2017-12-27 NOTE — Telephone Encounter (Signed)
Patient has been added to shingles waitlist

## 2017-12-27 NOTE — Assessment & Plan Note (Signed)
-  USPSTF grade A and B recommendations reviewed with patient; age-appropriate recommendations, preventive care, screening tests, etc discussed and encouraged; healthy living and sunscreen use encouraged; see AVS for patient education given to patient. Advanced directives packet given -Discussed importance of 150 minutes of physical activity weekly,  eat 6 servings of fruit/vegetables daily and drink plenty of water and avoid sweet beverages.  -Follow up and care instructions discussed and provided in AVS.  -Reviewed Health Maintenance:  -Mammogram has been scheduled by patient at facility in [place], [place], she will have records sent to our office -Colonoscopy up to date per patient, records have been requested to update HM  - HIV antibody; Future-Screening for HIV (human immunodeficiency virus) - Hepatitis C antibody; Future- Encounter for hepatitis C screening test for low risk patient - DG Bone Density; Future-Screening for osteoporosis

## 2017-12-28 LAB — HEPATITIS C ANTIBODY
HEP C AB: NONREACTIVE
SIGNAL TO CUT-OFF: 0.01 (ref <1.00)

## 2017-12-28 LAB — HIV ANTIBODY (ROUTINE TESTING W REFLEX): HIV: NONREACTIVE

## 2018-01-04 DIAGNOSIS — H25811 Combined forms of age-related cataract, right eye: Secondary | ICD-10-CM | POA: Diagnosis not present

## 2018-01-04 DIAGNOSIS — H2511 Age-related nuclear cataract, right eye: Secondary | ICD-10-CM | POA: Diagnosis not present

## 2018-01-05 MED FILL — NEO/POLY/DEXAMET EYE OINT: 3.5-10000-0 | 14 days supply | Qty: 4 | Fill #1

## 2018-01-05 MED FILL — SYNTHROID 50 MCG TABLET: 50 | 90 days supply | Qty: 90 | Fill #1

## 2018-01-05 MED FILL — ESTROGEN-METHYLTESTOS F.S.: 1.25-2.5 | 90 days supply | Qty: 90 | Fill #0

## 2018-01-06 MED FILL — TROKENDI XR 100 MG CAPSULE: 100 | 30 days supply | Qty: 30 | Fill #1

## 2018-01-11 MED FILL — MOXIFLOXACIN HCL 0.5 % SOLN: 0.5 | 15 days supply | Qty: 3 | Fill #1

## 2018-01-17 ENCOUNTER — Telehealth: Payer: Self-pay

## 2018-01-17 NOTE — Telephone Encounter (Signed)
Patient needs to schedule nurse visit to get first shingrix vaccine, let tamara,RN at [facility] know when appt is made so that both vaccines can be labeled

## 2018-01-19 NOTE — Telephone Encounter (Signed)
Patient called and she is schedule for 01/31/18 for her 1st shingrix vaccine

## 2018-01-19 NOTE — Telephone Encounter (Signed)
Both vaccines have been labeled and placed in refrig

## 2018-01-25 DIAGNOSIS — Z1231 Encounter for screening mammogram for malignant neoplasm of breast: Secondary | ICD-10-CM | POA: Diagnosis not present

## 2018-01-26 MED FILL — PREDNISOLONE AC 1% EYE DROP: 1 | 30 days supply | Qty: 10 | Fill #1

## 2018-01-31 ENCOUNTER — Ambulatory Visit: Payer: 59 | Admitting: Sports Medicine

## 2018-01-31 ENCOUNTER — Ambulatory Visit (INDEPENDENT_AMBULATORY_CARE_PROVIDER_SITE_OTHER): Payer: 59

## 2018-01-31 ENCOUNTER — Encounter: Payer: Self-pay | Admitting: Sports Medicine

## 2018-01-31 VITALS — HR 68 | Resp 16

## 2018-01-31 DIAGNOSIS — M2011 Hallux valgus (acquired), right foot: Secondary | ICD-10-CM | POA: Diagnosis not present

## 2018-01-31 DIAGNOSIS — L84 Corns and callosities: Secondary | ICD-10-CM

## 2018-01-31 DIAGNOSIS — M722 Plantar fascial fibromatosis: Secondary | ICD-10-CM

## 2018-01-31 DIAGNOSIS — Z299 Encounter for prophylactic measures, unspecified: Secondary | ICD-10-CM

## 2018-01-31 DIAGNOSIS — M2042 Other hammer toe(s) (acquired), left foot: Secondary | ICD-10-CM

## 2018-01-31 DIAGNOSIS — M2041 Other hammer toe(s) (acquired), right foot: Secondary | ICD-10-CM

## 2018-01-31 NOTE — Progress Notes (Signed)
Subjective: Brooke Stuart is a 67 y.o. female patient who presents to office for evaluation of Bilateral foot pain. Patient complains of progressive pain especially over the last several months at right bunion with numbness to the toe and in between 4-5 toes and with her 5th toe nails spliting. Reports that getting new shoes have helped but is on feet a lot with work. Denies injury/trip/fall/sprain/any causative factors.   Review of Systems  Musculoskeletal: Positive for joint pain.  Skin:       Corn Previous wart history  All other systems reviewed and are negative.    Patient Active Problem List   Diagnosis Date Noted  . Encounter for general adult medical examination with abnormal findings 12/27/2017  . Menopause 11/30/2017  . Gastroesophageal reflux disease 11/30/2017  . Hypothyroidism 11/30/2017  . Migraine 11/30/2017  . Anxiety 11/30/2017  . Chronic shoulder pain 11/30/2017    Current Outpatient Medications on File Prior to Visit  Medication Sig Dispense Refill  . DULoxetine (CYMBALTA) 60 MG capsule Take 60 mg by mouth 2 (two) times daily.    Marland Kitchen EST ESTROGENS-METHYLTEST DS 1.25-2.5 MG TABS Take by mouth.    . lansoprazole (PREVACID) 30 MG capsule Take 30 mg by mouth 2 (two) times daily before a meal.    . levothyroxine (SYNTHROID, LEVOTHROID) 50 MCG tablet Take 50 mcg by mouth daily before breakfast.    . montelukast (SINGULAIR) 10 MG tablet Take 10 mg by mouth at bedtime.    . naproxen (NAPROSYN) 500 MG tablet Take 500 mg by mouth as needed.    . Topiramate ER (TROKENDI XR) 100 MG CP24 Take by mouth.    . traMADol (ULTRAM) 50 MG tablet Take 50 mg by mouth as needed.    . TURMERIC PO Take 1,000 mg by mouth.     No current facility-administered medications on file prior to visit.     Allergies  Allergen Reactions  . Codeine Nausea And Vomiting    Objective:  General: Alert and oriented x3 in no acute distress  Dermatology: No open lesions bilateral lower extremities,  no webspace macerations, no ecchymosis bilateral, all nails x 10 are well manicured but spliting of nail on right 5th toe which is likely because of varus toe  Vascular: Dorsalis Pedis and Posterior Tibial pedal pulses palpable, Capillary Fill Time 3 seconds,(+) pedal hair growth bilateral, no edema bilateral lower extremities, Temperature gradient within normal limits.  Neurology: Johney Maine sensation intact via light touch bilateral, subjective numbness to right great toe  Musculoskeletal: Mild tenderness with palpation at right bunion, 4-5 hammertoes with varus rotation of the 5th toe,No pain with calf compression bilateral. Tight plantar fascia bilateral with no pain but fibromas, strength within normal limits in all groups bilateral.   Xrays  Right Foot   Impression:mild hypertrophic 1st met head, IM within normal limits. + hammertoe, No other acute findings.  Assessment and Plan: Problem List Items Addressed This Visit    None    Visit Diagnoses    Hallux valgus of right foot    -  Primary   Relevant Orders   DG Foot Complete Right   Hammer toes of both feet       Corn of toe       Plantar fasciitis           -Complete examination performed -Xrays reviewed -Discussed treatement options  -Dispensed silicone spacers -Recommend good supportive shoes and orthotics; office to check benefits -Recommend self nail and corn care as  needed -Patient to return to office as needed or sooner if condition worsens.  Landis Martins, DPM

## 2018-02-21 ENCOUNTER — Telehealth: Payer: Self-pay | Admitting: Neurology

## 2018-02-21 ENCOUNTER — Encounter: Payer: Self-pay | Admitting: Neurology

## 2018-02-21 ENCOUNTER — Ambulatory Visit: Payer: 59 | Admitting: Neurology

## 2018-02-21 VITALS — BP 139/89 | HR 74 | Ht 63.75 in | Wt 142.0 lb

## 2018-02-21 DIAGNOSIS — R400 Somnolence: Secondary | ICD-10-CM | POA: Diagnosis not present

## 2018-02-21 DIAGNOSIS — G4700 Insomnia, unspecified: Secondary | ICD-10-CM | POA: Diagnosis not present

## 2018-02-21 DIAGNOSIS — R0683 Snoring: Secondary | ICD-10-CM

## 2018-02-21 DIAGNOSIS — R51 Headache with orthostatic component, not elsewhere classified: Secondary | ICD-10-CM

## 2018-02-21 DIAGNOSIS — G43709 Chronic migraine without aura, not intractable, without status migrainosus: Secondary | ICD-10-CM | POA: Diagnosis not present

## 2018-02-21 DIAGNOSIS — G8929 Other chronic pain: Secondary | ICD-10-CM

## 2018-02-21 DIAGNOSIS — R519 Headache, unspecified: Secondary | ICD-10-CM

## 2018-02-21 MED ORDER — ERENUMAB-AOOE 140 MG/ML ~~LOC~~ SOAJ: 140.0000 mg | SUBCUTANEOUS | 11 refills | Status: DC

## 2018-02-21 MED ORDER — ONDANSETRON 4 MG PO TBDP: 4.0000 mg | ORAL_TABLET | Freq: Three times a day (TID) | ORAL | 11 refills | Status: DC | PRN

## 2018-02-21 MED ORDER — RIZATRIPTAN BENZOATE 10 MG PO TABS: 10.0000 mg | ORAL_TABLET | ORAL | 11 refills | Status: DC | PRN

## 2018-02-21 MED FILL — RIZATRIPTAN BENZOATE 10 MG: 10 | 30 days supply | Qty: 10 | Fill #0

## 2018-02-21 MED FILL — ONDANSETRON ODT 4 MG TABLET: 4 | 10 days supply | Qty: 30 | Fill #0

## 2018-02-21 NOTE — Patient Instructions (Addendum)
- Recommend Aimovig and botox for migraines - Will try to decrease Trokendi as she feels better with the Aimovig and consider adding on botox if needed - Discussed overuse of Excedrin, provided information and literatue, needs to decrease use - At onset of headache: Rizatriptan and zofran may repeat in 2 hours max twice in one day - Start Aimovig - Will follow and see how she does but may consider deceasing trokendi if feeling better.  - MRI brain w/wo contrast need a lab for kidney function - sleep study  Brand Names: [place]  Aimovig  What is this drug used for?   It is used to prevent migraine headaches.  What do I need to tell my doctor BEFORE I take this drug?   If you have an allergy to this drug or any part of this drug.   If you are allergic to any drugs like this one, any other drugs, foods, or other substances. Tell your doctor about the allergy and what signs you had, like rash; hives; itching; shortness of breath; wheezing; cough; swelling of face, lips, tongue, or throat; or any other signs.   This drug may interact with other drugs or health problems.   Tell your doctor and pharmacist about all of your drugs (prescription or OTC, natural products, vitamins) and health problems. You must check to make sure that it is safe for you to take this drug with all of your drugs and health problems. Do not start, stop, or change the dose of any drug without checking with your doctor.  What are some things I need to know or do while I take this drug?   Tell all of your health care providers that you take this drug. This includes your doctors, nurses, pharmacists, and dentists.   If you have a latex allergy, talk with your doctor.   Tell your doctor if you are pregnant or plan on getting pregnant. You will need to talk about the benefits and risks of using this drug while you are pregnant.   Tell your doctor if you are breast-feeding. You will need to talk about any risks to your  baby.  What are some side effects that I need to call my doctor about right away?   WARNING/CAUTION: Even though it may be rare, some people may have very bad and sometimes deadly side effects when taking a drug. Tell your doctor or get medical help right away if you have any of the following signs or symptoms that may be related to a very bad side effect:   Signs of an allergic reaction, like rash; hives; itching; red, swollen, blistered, or peeling skin with or without fever; wheezing; tightness in the chest or throat; trouble breathing, swallowing, or talking; unusual hoarseness; or swelling of the mouth, face, lips, tongue, or throat.  What are some other side effects of this drug?   All drugs may cause side effects. However, many people have no side effects or only have minor side effects. Call your doctor or get medical help if any of these side effects or any other side effects bother you or do not go away:   Redness or swelling where the shot is given.   Pain where the shot was given.   Constipation.   These are not all of the side effects that may occur. If you have questions about side effects, call your doctor. Call your doctor for medical advice about side effects.   You may report side effects  to your national health agency.  How is this drug best taken?   Use this drug as ordered by your doctor. Read all information given to you. Follow all instructions closely.   It is given as a shot into the fatty part of the skin on the top of the thigh, belly area, or upper arm.   If you will be giving yourself the shot, your doctor or nurse will teach you how to give the shot.   Follow how to use as you have been told by the doctor or read the package insert.   If stored in a refrigerator, let this drug come to room temperature before using it. Leave it at room temperature for at least 30 minutes. Do not heat this drug.   Protect from heat and sunlight.   Do not shake.   Do not give into  skin that is irritated, bruised, red, infected, or scarred.   Do not use if the solution is cloudy, leaking, or has particles.   Do not use if solution changes color.   Throw away after using. Do not use the device more than 1 time.   Throw away needles in a needle/sharp disposal box. Do not reuse needles or other items. When the box is full, follow all local rules for getting rid of it. Talk with a doctor or pharmacist if you have any questions.  What do I do if I miss a dose?   Take a missed dose as soon as you think about it.   After taking a missed dose, start a new schedule based on when the dose is taken.  How do I store and/or throw out this drug?   Store in a refrigerator. Do not freeze.   Store in the carton to protect from light.   Do not use if it has been frozen.   If you drop this drug on a hard surface, do not use it.   If needed, you may store at room temperature for up to 7 days. Write down the date you take this drug out of the refrigerator. If stored at room temperature and not used within 7 days, throw this drug away.   Do not put this drug back in the refrigerator after it has been stored at room temperature.   Keep all drugs in a safe place. Keep all drugs out of the reach of children and pets.   Throw away unused or expired drugs. Do not flush down a toilet or pour down a drain unless you are told to do so. Check with your pharmacist if you have questions about the best way to throw out drugs. There may be drug take-back programs in your area.  General drug facts   If your symptoms or health problems do not get better or if they become worse, call your doctor.   Do not share your drugs with others and do not take anyone else's drugs.   Keep a list of all your drugs (prescription, natural products, vitamins, OTC) with you. Give this list to your doctor.   Talk with the doctor before starting any new drug, including prescription or OTC, natural products, or vitamins.    Some drugs may have another patient information leaflet. If you have any questions about this drug, please talk with your doctor, nurse, pharmacist, or other health care provider.   If you think there has been an overdose, call your poison control center or get medical care right away. Be  ready to tell or show what was taken, how much, and when it happened.    Ondansetron oral dissolving tablet What is this medicine? ONDANSETRON (on DAN se tron) is used to treat nausea and vomiting caused by chemotherapy. It is also used to prevent or treat nausea and vomiting after surgery. This medicine may be used for other purposes; ask your health care provider or pharmacist if you have questions. COMMON BRAND NAME(S): Zofran ODT What should I tell my health care provider before I take this medicine? They need to know if you have any of these conditions: -heart disease -history of irregular heartbeat -liver disease -low levels of magnesium or potassium in the blood -an unusual or allergic reaction to ondansetron, granisetron, other medicines, foods, dyes, or preservatives -pregnant or trying to get pregnant -breast-feeding How should I use this medicine? These tablets are made to dissolve in the mouth. Do not try to push the tablet through the foil backing. With dry hands, peel away the foil backing and gently remove the tablet. Place the tablet in the mouth and allow it to dissolve, then swallow. While you may take these tablets with water, it is not necessary to do so. Talk to your pediatrician regarding the use of this medicine in children. Special care may be needed. Overdosage: If you think you have taken too much of this medicine contact a poison control center or emergency room at once. NOTE: This medicine is only for you. Do not share this medicine with others. What if I miss a dose? If you miss a dose, take it as soon as you can. If it is almost time for your next dose, take only that dose. Do  not take double or extra doses. What may interact with this medicine? Do not take this medicine with any of the following medications: -apomorphine -certain medicines for fungal infections like fluconazole, itraconazole, ketoconazole, posaconazole, voriconazole -cisapride -dofetilide -dronedarone -pimozide -thioridazine -ziprasidone This medicine may also interact with the following medications: -carbamazepine -certain medicines for depression, anxiety, or psychotic disturbances -fentanyl -linezolid -MAOIs like Carbex, Eldepryl, Marplan, Nardil, and Parnate -methylene blue (injected into a vein) -other medicines that prolong the QT interval (cause an abnormal heart rhythm) -phenytoin -rifampicin -tramadol This list may not describe all possible interactions. Give your health care provider a list of all the medicines, herbs, non-prescription drugs, or dietary supplements you use. Also tell them if you smoke, drink alcohol, or use illegal drugs. Some items may interact with your medicine. What should I watch for while using this medicine? Check with your doctor or health care professional as soon as you can if you have any sign of an allergic reaction. What side effects may I notice from receiving this medicine? Side effects that you should report to your doctor or health care professional as soon as possible: -allergic reactions like skin rash, itching or hives, swelling of the face, lips, or tongue -breathing problems -confusion -dizziness -fast or irregular heartbeat -feeling faint or lightheaded, falls -fever and chills -loss of balance or coordination -seizures -sweating -swelling of the hands and feet -tightness in the chest -tremors -unusually weak or tired Side effects that usually do not require medical attention (report to your doctor or health care professional if they continue or are bothersome): -constipation or diarrhea -headache This list may not describe all  possible side effects. Call your doctor for medical advice about side effects. You may report side effects to FDA at 1-800-FDA-1088. Where should I keep my medicine?  Keep out of the reach of children. Store between 2 and 30 degrees C (36 and 86 degrees F). Throw away any unused medicine after the expiration date. NOTE: This sheet is a summary. It may not cover all possible information. If you have questions about this medicine, talk to your doctor, pharmacist, or health care provider.  2018 [employer] Standard (2013-03-14 16:21:52) Rizatriptan disintegrating tablets What is this medicine? RIZATRIPTAN (rye za TRIP tan) is used to treat migraines with or without aura. An aura is a strange feeling or visual disturbance that warns you of an attack. It is not used to prevent migraines. This medicine may be used for other purposes; ask your health care provider or pharmacist if you have questions. COMMON BRAND NAME(S): Maxalt-MLT What should I tell my health care provider before I take this medicine? They need to know if you have any of these conditions: -bowel disease or colitis -diabetes -family history of heart disease -fast or irregular heart beat -heart or blood vessel disease, angina (chest pain), or previous heart attack -high blood pressure -high cholesterol -history of stroke, transient ischemic attacks (TIAs or mini-strokes), or intracranial bleeding -kidney or liver disease -overweight -poor circulation -postmenopausal or surgical removal of uterus and ovaries -an unusual or allergic reaction to rizatriptan, other medicines, foods, dyes, or preservatives -pregnant or trying to get pregnant -breast-feeding How should I use this medicine? Take this medicine by mouth. Follow the directions on the prescription label. This medicine is taken at the first symptoms of a migraine. It is not for everyday use. Leave the tablet in the foil package until you are ready to take it. Do not push  the tablet through the blister pack. Peel open the blister pack with dry hands and place the tablet on your tongue. The tablet will dissolve rapidly and be swallowed in your saliva. It is not necessary to drink any water to take this medicine. If your migraine headache returns after one dose, you can take another dose as directed. You must leave at least 2 hours between doses, and do not take more than 30 mg total in 24 hours. If there is no improvement at all after the first dose, do not take a second dose without talking to your doctor or health care professional. Do not take your medicine more often than directed. Talk to your pediatrician regarding the use of this medicine in children. While this drug may be prescribed for children as young as 6 years for selected conditions, precautions do apply. Overdosage: If you think you have taken too much of this medicine contact a poison control center or emergency room at once. NOTE: This medicine is only for you. Do not share this medicine with others. What if I miss a dose? This does not apply; this medicine is not for regular use. What may interact with this medicine? Do not take this medicine with any of the following medicines: -amphetamine, dextroamphetamine or cocaine -dihydroergotamine, ergotamine, ergoloid mesylates, methysergide, or ergot-type medication - do not take within 24 hours of taking rizatriptan -feverfew -MAOIs like Carbex, Eldepryl, Marplan, Nardil, and Parnate - do not take rizatriptan within 2 weeks of stopping MAOI therapy. -other migraine medicines like almotriptan, eletriptan, naratriptan, sumatriptan, zolmitriptan - do not take within 24 hours of taking rizatriptan -tryptophan This medicine may also interact with the following medications: -medicines for mental depression, anxiety or mood problems -propranolol This list may not describe all possible interactions. Give your health care provider a list of all the  medicines,  herbs, non-prescription drugs, or dietary supplements you use. Also tell them if you smoke, drink alcohol, or use illegal drugs. Some items may interact with your medicine. What should I watch for while using this medicine? Only take this medicine for a migraine headache. Take it if you get warning symptoms or at the start of a migraine attack. It is not for regular use to prevent migraine attacks. You may get drowsy or dizzy. Do not drive, use machinery, or do anything that needs mental alertness until you know how this medicine affects you. To reduce dizzy or fainting spells, do not sit or stand up quickly, especially if you are an older patient. Alcohol can increase drowsiness, dizziness and flushing. Avoid alcoholic drinks. Smoking cigarettes may increase the risk of heart-related side effects from using this medicine. If you take migraine medicines for 10 or more days a month, your migraines may get worse. Keep a diary of headache days and medicine use. Contact your healthcare professional if your migraine attacks occur more frequently. What side effects may I notice from receiving this medicine? Side effects that you should report to your doctor or health care professional as soon as possible: -allergic reactions like skin rash, itching or hives, swelling of the face, lips, or tongue -fast, slow, or irregular heart beat -increased or decreased blood pressure -seizures -severe stomach pain and cramping, bloody diarrhea -signs and symptoms of a blood clot such as breathing problems; changes in vision; chest pain; severe, sudden headache; pain, swelling, warmth in the leg; trouble speaking; sudden numbness or weakness of the face, arm or leg -tingling, pain, or numbness in the face, hands, or feet Side effects that usually do not require medical attention (report to your doctor or health care professional if they continue or are bothersome): -drowsiness -dry mouth -feeling warm, flushing, or  redness of the face -headache -muscle cramps, pain -nausea, vomiting -unusually weak or tired This list may not describe all possible side effects. Call your doctor for medical advice about side effects. You may report side effects to FDA at 1-800-FDA-1088. Where should I keep my medicine? Keep out of the reach of children. Store at room temperature between 15 and 30 degrees C (59 and 86 degrees F). Protect from light and moisture. Throw away any unused medicine after the expiration date. NOTE: This sheet is a summary. It may not cover all possible information. If you have questions about this medicine, talk to your doctor, pharmacist, or health care provider.  2018 [employer]/Gold Standard (2013-02-06 10:17:42)

## 2018-02-21 NOTE — Progress Notes (Signed)
[facility]    Provider:  Dr Jaynee Eagles Referring Provider: Lance Sell, NP Primary Care Physician:  Lance Sell, NP  CC:  Migraine  HPI:  Brooke Stuart is a 67 y.o. female here as requested by Dr. Gayla Medicus for migraine. Migraines since 2005. No FHx of migraines, possibly uncle had "sick headaches". She gets nausea and dizziness. Headaches would start unilaterally but often bilaterally, pounding and throbbing, a lot on the crown of the head. laying down helps, no aura, + mild light sensitivity, no smell sensitivity, but she has significant sound sensitivity, laying down in a dark room helps, she will vomit. She is feelig better on Trokendi. But short-term memory loss and having side effects. She has 15 headache days a month and 10-12 are migrainous. No aura. Can last > 24 hours if untreated or treated. Headaches can be in the morning and wakes up worse supine. Can be positionally worse as well. No other focal neurologic deficits, associated symptoms, inciting events or modifiable factors.  Reviewed notes, labs and imaging from outside physicians, which showed:  MRI brain reviewed images which was normal 07/23/2016.  Review of Systems: Patient complains of symptoms per HPI as well as the following symptoms: headache, nausea, not enough sleep. Pertinent negatives and positives per HPI. All others negative.   Social History   Socioeconomic History  . Marital status: Married    Spouse name: Not on file  . Number of children: 0  . Years of education: Not on file  . Highest education level: Associate degree: occupational, Hotel manager, or vocational program  Occupational History  . Not on file  Social Needs  . Financial resource strain: Not on file  . Food insecurity:    Worry: Not on file    Inability: Not on file  . Transportation needs:    Medical: Not on file    Non-medical: Not on file  Tobacco Use  . Smoking status: Never Smoker  . Smokeless  tobacco: Never Used  Substance and Sexual Activity  . Alcohol use: Never    Frequency: Never  . Drug use: Never  . Sexual activity: Yes    Birth control/protection: Surgical  Lifestyle  . Physical activity:    Days per week: Not on file    Minutes per session: Not on file  . Stress: Not on file  Relationships  . Social connections:    Talks on phone: Not on file    Gets together: Not on file    Attends religious service: Not on file    Active member of club or organization: Not on file    Attends meetings of clubs or organizations: Not on file    Relationship status: Not on file  . Intimate partner violence:    Fear of current or ex partner: Not on file    Emotionally abused: Not on file    Physically abused: Not on file    Forced sexual activity: Not on file  Other Topics Concern  . Not on file  Social History Narrative   Lives at home with her husband   Right handed   Caffeine: 1 large cup daily    Family History  Problem Relation Age of Onset  . Arthritis Mother   . Diabetes Mother   . Hearing loss Mother   . Heart disease Mother   . Dementia Mother   . Stroke Father   . Alcohol abuse Father   . Hypertension Brother     Past Medical  History:  Diagnosis Date  . Endometriosis   . GERD (gastroesophageal reflux disease)   . History of chicken pox   . Migraines   . Thyroid disease     Past Surgical History:  Procedure Laterality Date  . ABDOMINAL HYSTERECTOMY     complete  . exploratory uterine surgery      for endometriosis    Current Outpatient Medications  Medication Sig Dispense Refill  . DULoxetine (CYMBALTA) 60 MG capsule Take 60 mg by mouth 2 (two) times daily.    Marland Kitchen EST ESTROGENS-METHYLTEST DS 1.25-2.5 MG TABS Take by mouth.    . lansoprazole (PREVACID) 30 MG capsule Take 30 mg by mouth 2 (two) times daily before a meal.    . levothyroxine (SYNTHROID, LEVOTHROID) 50 MCG tablet Take 50 mcg by mouth daily before breakfast.    . montelukast  (SINGULAIR) 10 MG tablet Take 10 mg by mouth at bedtime.    . naproxen (NAPROSYN) 500 MG tablet Take 500 mg by mouth as needed.    . Topiramate ER (TROKENDI XR) 100 MG CP24 Take by mouth.    . traMADol (ULTRAM) 50 MG tablet Take 50 mg by mouth as needed.    Eduard Roux (AIMOVIG) 140 MG/ML SOAJ Inject 140 mg into the skin every 30 (thirty) days. 1 pen 11  . ondansetron (ZOFRAN-ODT) 4 MG disintegrating tablet Take 1 tablet (4 mg total) by mouth every 8 (eight) hours as needed for nausea. 30 tablet 11  . rizatriptan (MAXALT) 10 MG tablet Take 1 tablet (10 mg total) by mouth as needed for migraine. May repeat in 2 hours if needed 10 tablet 11   No current facility-administered medications for this visit.     Allergies as of 02/21/2018 - Review Complete 02/21/2018  Allergen Reaction Noted  . Codeine Nausea And Vomiting 01/31/2018    Vitals: BP 139/89 (BP Location: Right Arm, Patient Position: Sitting)   Pulse 74   Ht 5' 3.75" (1.619 m)   Wt 142 lb (64.4 kg)   BMI 24.57 kg/m  Last Weight:  Wt Readings from Last 1 Encounters:  02/21/18 142 lb (64.4 kg)   Last Height:   Ht Readings from Last 1 Encounters:  02/21/18 5' 3.75" (1.619 m)   Physical exam: Exam: Gen: NAD, conversant, well nourised, well groomed                     CV: RRR, no MRG. No Carotid Bruits. No peripheral edema, warm, nontender Eyes: Conjunctivae clear without exudates or hemorrhage  Neuro: Detailed Neurologic Exam  Speech:    Speech is normal; fluent and spontaneous with normal comprehension.  Cognition:    The patient is oriented to person, place, and time;     recent and remote memory intact;     language fluent;     normal attention, concentration,     fund of knowledge Cranial Nerves:    The pupils are equal, round, and reactive to light. Attempted fundoscopic exam could not visualize.  Visual fields are full to finger confrontation. Extraocular movements are intact. Trigeminal sensation is intact  and the muscles of mastication are normal. The face is symmetric. The palate elevates in the midline. Hearing intact. Voice is normal. Shoulder shrug is normal. The tongue has normal motion without fasciculations.   Coordination:    Normal finger to nose and heel to shin.  Gait:    Heel-toe and tandem gait are normal.   Motor Observation:    No  asymmetry, no atrophy, and no involuntary movements noted. Tone:    Normal muscle tone.    Posture:    Posture is normal. normal erect    Strength:    Strength is V/V in the upper and lower limbs.      Sensation: intact to LT     Reflex Exam:  DTR's:    Deep tendon reflexes in the upper and lower extremities are normal bilaterally.   Toes:    The toes are equiv bilaterally.   Clonus:    Clonus is absent.       Assessment/Plan:  67 year old with chronic migraines for 15 years failed multiple classes of medications now on Topiramate.   Meds tried: Cymbalta, Trokendi, Topiramate, and multiple in the past including she can't remember.  - Recommend Aimovig and botox for migraines - Patient prefers to start [place] first and follow - Will try to decrease Trokendi as she feels better with the Aimovig and consider adding on botox  - Discussed overuse of Excedrin, provided information and literatue, needs to decrease use - At onset of headache: Rizatriptan and zofran - Start Aimovig - Will follow and see how she do but may consider deceasing trokendi if feeling better.  - she wakes hourly, she keeps her husband up at night, she snores, very fatigued. - MRI brain due to concerning symptoms of morning headaches, positional headaches,vision changes  to look for space occupying mass, chiari or intracranial hypertension (pseudotumor). - labs  Discussed: To prevent or relieve headaches, try the following: Cool Compress. Lie down and place a cool compress on your head.  Avoid headache triggers. If certain foods or odors seem to have triggered  your migraines in the past, avoid them. A headache diary might help you identify triggers.  Include physical activity in your daily routine. Try a daily walk or other moderate aerobic exercise.  Manage stress. Find healthy ways to cope with the stressors, such as delegating tasks on your to-do list.  Practice relaxation techniques. Try deep breathing, yoga, massage and visualization.  Eat regularly. Eating regularly scheduled meals and maintaining a healthy diet might help prevent headaches. Also, drink plenty of fluids.  Follow a regular sleep schedule. Sleep deprivation might contribute to headaches Consider biofeedback. With this mind-body technique, you learn to control certain bodily functions - such as muscle tension, heart rate and blood pressure - to prevent headaches or reduce headache pain.    Proceed to emergency room if you experience new or worsening symptoms or symptoms do not resolve, if you have new neurologic symptoms or if headache is severe, or for any concerning symptom.   Provided education and documentation from American headache Society toolbox including articles on: chronic migraine medication overuse headache, chronic migraines, prevention of migraines, behavioral and other nonpharmacologic treatments for headache.  Orders Placed This Encounter  Procedures  . MR BRAIN W WO CONTRAST  . Basic Metabolic Panel  . Ambulatory referral to Sleep Studies   Meds ordered this encounter  Medications  . rizatriptan (MAXALT) 10 MG tablet    Sig: Take 1 tablet (10 mg total) by mouth as needed for migraine. May repeat in 2 hours if needed    Dispense:  10 tablet    Refill:  11  . ondansetron (ZOFRAN-ODT) 4 MG disintegrating tablet    Sig: Take 1 tablet (4 mg total) by mouth every 8 (eight) hours as needed for nausea.    Dispense:  30 tablet    Refill:  11  . Erenumab-aooe (AIMOVIG) 140 MG/ML SOAJ    Sig: Inject 140 mg into the skin every 30 (thirty) days.    Dispense:  1 pen      Refill:  [place], MD  [facility] [address] [place],  [address]  Phone [phone] Fax [phone]

## 2018-02-21 NOTE — Progress Notes (Signed)
Epworth Sleepiness Scale 0= would never doze 1= slight chance of dozing 2= moderate chance of dozing 3= high chance of dozing  Sitting and reading: 3 Watching TV: 2 Sitting inactive in a public place (ex. Theater or meeting): 2 As a passenger in a car for an hour without a break: 1 Lying down to rest in the afternoon: 1 Sitting and talking to someone: 0 Sitting quietly after lunch (no alcohol): 1 In a car, while stopped in traffic: 0 Total: 10

## 2018-02-21 NOTE — Telephone Encounter (Signed)
Just an FYI.    I spoke to the patient and she informed me she would like to hold off right now on having the MRI due to the cost she would like to see how the medicine goes first.

## 2018-02-21 NOTE — Telephone Encounter (Signed)
lvm for pt to call back about scheduling mri  [facility] Auth: [place] Ref # [id number]

## 2018-02-22 ENCOUNTER — Telehealth: Payer: Self-pay

## 2018-02-22 NOTE — Telephone Encounter (Signed)
Approval letter from [employer] receive . Pt approve from 02/22/2019 to 08/21/2018 for [place]. PA reference [id number], Plan code [place]. Contact number is [phone].

## 2018-02-22 NOTE — Telephone Encounter (Signed)
PA done on cover my meds on [employer]. Decision pending.

## 2018-02-23 MED FILL — AIMOVIG 140 MG/ML SOAJ: 140 | 30 days supply | Qty: 1 | Fill #0

## 2018-02-23 NOTE — Telephone Encounter (Signed)
RN call [place]. Rn stated the pts Aimovig was approve via patients insurance.

## 2018-02-28 DIAGNOSIS — F419 Anxiety disorder, unspecified: Secondary | ICD-10-CM | POA: Diagnosis not present

## 2018-02-28 DIAGNOSIS — G43909 Migraine, unspecified, not intractable, without status migrainosus: Secondary | ICD-10-CM | POA: Diagnosis not present

## 2018-02-28 DIAGNOSIS — E039 Hypothyroidism, unspecified: Secondary | ICD-10-CM | POA: Diagnosis not present

## 2018-02-28 DIAGNOSIS — E559 Vitamin D deficiency, unspecified: Secondary | ICD-10-CM | POA: Diagnosis not present

## 2018-02-28 DIAGNOSIS — E785 Hyperlipidemia, unspecified: Secondary | ICD-10-CM | POA: Diagnosis not present

## 2018-03-03 MED FILL — TROKENDI XR 100 MG CAPSULE: 100 | 30 days supply | Qty: 30 | Fill #2

## 2018-03-03 MED FILL — DULoxetine HCL 60 MG CPEP: 60 | 90 days supply | Qty: 180 | Fill #0

## 2018-03-14 ENCOUNTER — Ambulatory Visit (INDEPENDENT_AMBULATORY_CARE_PROVIDER_SITE_OTHER): Payer: 59 | Admitting: Orthotics

## 2018-03-14 DIAGNOSIS — M722 Plantar fascial fibromatosis: Secondary | ICD-10-CM

## 2018-03-14 DIAGNOSIS — M2011 Hallux valgus (acquired), right foot: Secondary | ICD-10-CM

## 2018-03-14 MED FILL — PHENTERMINE 37.5 MG TABLET: 37.5 | 30 days supply | Qty: 30 | Fill #0

## 2018-03-14 NOTE — Progress Notes (Signed)
Patient came into today to be cast for Custom Foot Orthotics. Upon recommendation of Dr. Cannon Kettle Patient presents with HAV RT with numbness in toes Goals are dropping the first ray and offloading the 1st met head Plan vendor Richie semi rigid device with k-wedge under 1st met head.

## 2018-04-04 ENCOUNTER — Other Ambulatory Visit: Payer: 59 | Admitting: Orthotics

## 2018-04-06 MED FILL — AIMOVIG 140 MG/ML SOAJ: 140 | 30 days supply | Qty: 1 | Fill #1

## 2018-04-06 MED FILL — MONTELUKAST SOD 10 MG TAB: 10 | 90 days supply | Qty: 90 | Fill #1

## 2018-04-06 MED FILL — LANSOPRAZOLE DR 30 MG CAP: 30 | 30 days supply | Qty: 60 | Fill #0

## 2018-04-18 ENCOUNTER — Ambulatory Visit: Payer: 59 | Admitting: Neurology

## 2018-04-18 ENCOUNTER — Encounter: Payer: Self-pay | Admitting: Neurology

## 2018-04-18 ENCOUNTER — Ambulatory Visit (INDEPENDENT_AMBULATORY_CARE_PROVIDER_SITE_OTHER): Payer: 59 | Admitting: Orthotics

## 2018-04-18 VITALS — BP 102/66 | HR 74 | Ht 63.75 in | Wt 136.0 lb

## 2018-04-18 DIAGNOSIS — R51 Headache: Secondary | ICD-10-CM

## 2018-04-18 DIAGNOSIS — G4730 Sleep apnea, unspecified: Secondary | ICD-10-CM | POA: Diagnosis not present

## 2018-04-18 DIAGNOSIS — G2581 Restless legs syndrome: Secondary | ICD-10-CM | POA: Diagnosis not present

## 2018-04-18 DIAGNOSIS — M722 Plantar fascial fibromatosis: Secondary | ICD-10-CM

## 2018-04-18 DIAGNOSIS — G4726 Circadian rhythm sleep disorder, shift work type: Secondary | ICD-10-CM | POA: Insufficient documentation

## 2018-04-18 DIAGNOSIS — R0683 Snoring: Secondary | ICD-10-CM | POA: Diagnosis not present

## 2018-04-18 DIAGNOSIS — G478 Other sleep disorders: Secondary | ICD-10-CM | POA: Diagnosis not present

## 2018-04-18 DIAGNOSIS — G4710 Hypersomnia, unspecified: Secondary | ICD-10-CM | POA: Diagnosis not present

## 2018-04-18 DIAGNOSIS — M2011 Hallux valgus (acquired), right foot: Secondary | ICD-10-CM

## 2018-04-18 DIAGNOSIS — R519 Headache, unspecified: Secondary | ICD-10-CM | POA: Insufficient documentation

## 2018-04-18 NOTE — Progress Notes (Signed)
Patient came in today to pick up custom made foot orthotics.  The goals were accomplished and the patient reported no dissatisfaction with said orthotics.  Patient was advised of breakin period and how to report any issues. 

## 2018-04-18 NOTE — Patient Instructions (Signed)
Please remember to try to maintain good sleep hygiene, which means: Keep a regular sleep and wake schedule, try not to exercise or have a meal within 2 hours of your bedtime, try to keep your bedroom conducive for sleep, that is, cool and dark, without light distractors such as an illuminated alarm clock, and refrain from watching TV right before sleep or in the middle of the night and do not keep the TV or radio on during the night. Also, try not to use or play on electronic devices at bedtime, such as your cell phone, tablet PC or laptop. If you like to read at bedtime on an electronic device, try to dim the background light as much as possible. Do not eat in the middle of the night.   We will request a sleep study. We will look for leg twitching and snoring or sleep apnea.   For chronic insomnia, you are best followed by a psychiatrist and/or sleep psychologist.  We will call you with the sleep study results.

## 2018-04-18 NOTE — Progress Notes (Signed)
[facility]INE [facility]   Provider:  Larey Seat, [place] D  Primary Care Physician:  Lance Sell, NP   Referring Provider: Sarina Ill, MD    Chief Complaint  Patient presents with  . New Patient (Initial Visit)    rm 11, pt alone, pt describes waking up every 1-2 hrs during her sleep. complains of being tired through out the day. around 3 pm she feels like she needs a nap. pt snores per husband and she states her husband has mentioned witnessing apnea events. never had a sleep study    HPI:  Brooke Stuart is a 67 y.o. female patient , seen here on 04-18-2018 for a sleep consultation.  She has struggled with Migraines since teenage and at age 40 had her first status migrainosus ( postoperative) a- these resolved and came back in 2005, henceforth chronically ongoing migraines without aura. Poor sleep may be a trigger. Chief complaint according to patient : She was a night shift worker from 1975- 2015, and never slept well in daytime. Her husband has similar disturbed sleep, third shift worker, too.  She changed to day shifts on weekends, as a CT technologist, but her circadian rhythm remains disturbed- she wakes up every hour and looks at her watch hourly.    Sleep habits are as follows: Dinnertime for the couple is around 7 PM, and they watch TV in the evenings together, her husband may stay up until 4 AM before he goes to bed but she usually goes to bed around 11:30 PM. The bedroom is cool, quiet and dark and she shares a bedroom with her husband.  There is a TV in the bedroom but it is never switched on.  She turns and moves a lot in her sleep and before she goes to sleep, and there is no preferred sleep position.  It is a nonadjustable bed and she sleeps on one pillow. She has restless legs , and these interfere with her sleep latency. She wakes up every hour, dreams, but not vivid. Rises at 7.30 - her dog will wake her. She feels not refreshed and may have a headache in AM.  Doesn't report a dry mouth.   Sleep medical history and family sleep history: third shift circadian rhythm disorder. Brother has OSA, treated on CPAP -PS:  her husband has OSA refuses CPAP. No insomnia history. Her mother is 85 years old and sleeps only 5 hours.  She had a Tonsilectomy in childhood.     HPI per Dr. Jaynee Eagles. :  Brooke Stuart is a 67 y.o. female here as requested by Dr. Gayla Medicus for migraine. Migraines since 2005. No FHx of migraines, possibly uncle had "sick headaches". She gets nausea and dizziness. Headaches would start unilaterally but often bilaterally, pounding and throbbing, a lot on the crown of the head. laying down helps, no aura, + mild light sensitivity, no smell sensitivity, but she has significant sound sensitivity, laying down in a dark room helps, she will vomit. She is feelig better on Trokendi. But short-term memory loss and having side effects. She has 15 headache days a month and 10-12 are migrainous. No aura. Can last > 24 hours if untreated or treated. Headaches can be in the morning and wakes up worse supine. Can be positionally worse as well. No other focal neurologic deficits, associated symptoms, inciting events or modifiable factors.   Social history: married, no children. One dog, drinks no alcohol. Caffeine - 3 coffees a week , only at work, iced  tea only when going out.  Non smoker, not exposed to passive smoke, not vaping.   Review of Systems: Out of a complete 14 system review, the patient complains of only the following symptoms, and all other reviewed systems are negative.  Restless sleep, one nocturia, RLS, morning headaches   Epworth Sleepiness score :12/ 24 points   , Fatigue severity score: 39/ 63 points  , depression score n/a   Social History   Socioeconomic History  . Marital status: Married    Spouse name: Not on file  . Number of children: 0  . Years of education: Not on file  . Highest education level: Associate degree: occupational,  Hotel manager, or vocational program  Occupational History  . Not on file  Social Needs  . Financial resource strain: Not on file  . Food insecurity:    Worry: Not on file    Inability: Not on file  . Transportation needs:    Medical: Not on file    Non-medical: Not on file  Tobacco Use  . Smoking status: Never Smoker  . Smokeless tobacco: Never Used  Substance and Sexual Activity  . Alcohol use: Never    Frequency: Never  . Drug use: Never  . Sexual activity: Yes    Birth control/protection: Surgical  Lifestyle  . Physical activity:    Days per week: Not on file    Minutes per session: Not on file  . Stress: Not on file  Relationships  . Social connections:    Talks on phone: Not on file    Gets together: Not on file    Attends religious service: Not on file    Active member of club or organization: Not on file    Attends meetings of clubs or organizations: Not on file    Relationship status: Not on file  . Intimate partner violence:    Fear of current or ex partner: Not on file    Emotionally abused: Not on file    Physically abused: Not on file    Forced sexual activity: Not on file  Other Topics Concern  . Not on file  Social History Narrative   Lives at home with her husband   Right handed   Caffeine: 1 large cup daily    Family History  Problem Relation Age of Onset  . Arthritis Mother   . Diabetes Mother   . Hearing loss Mother   . Heart disease Mother   . Dementia Mother   . Stroke Father   . Alcohol abuse Father   . Hypertension Brother   . Heart attack Maternal Grandfather   . Heart attack Paternal Grandmother     Past Medical History:  Diagnosis Date  . Endometriosis   . GERD (gastroesophageal reflux disease)   . History of chicken pox   . Migraines   . Thyroid disease     Past Surgical History:  Procedure Laterality Date  . ABDOMINAL HYSTERECTOMY     complete  . exploratory uterine surgery      for endometriosis  . TONSILLECTOMY       Current Outpatient Medications  Medication Sig Dispense Refill  . CLARITIN-D 24 HOUR 10-240 MG 24 hr tablet Take 1 tablet by mouth daily as needed. as directed  5  . DULoxetine (CYMBALTA) 60 MG capsule Take 60 mg by mouth 2 (two) times daily.    Eduard Roux (AIMOVIG) 140 MG/ML SOAJ Inject 140 mg into the skin every 30 (thirty) days. 1 pen  11  . EST ESTROGENS-METHYLTEST DS 1.25-2.5 MG TABS Take by mouth.    . lansoprazole (PREVACID) 30 MG capsule Take 30 mg by mouth 2 (two) times daily before a meal.    . levothyroxine (SYNTHROID, LEVOTHROID) 50 MCG tablet Take 50 mcg by mouth daily before breakfast.    . montelukast (SINGULAIR) 10 MG tablet Take 10 mg by mouth at bedtime.    . naproxen (NAPROSYN) 500 MG tablet Take 500 mg by mouth as needed.    . ondansetron (ZOFRAN-ODT) 4 MG disintegrating tablet Take 1 tablet (4 mg total) by mouth every 8 (eight) hours as needed for nausea. 30 tablet 11  . rizatriptan (MAXALT) 10 MG tablet Take 1 tablet (10 mg total) by mouth as needed for migraine. May repeat in 2 hours if needed 10 tablet 11   No current facility-administered medications for this visit.     Allergies as of 04/18/2018 - Review Complete 04/18/2018  Allergen Reaction Noted  . Codeine Nausea And Vomiting 01/31/2018    Vitals: BP 102/66   Pulse 74   Ht 5' 3.75" (1.619 m)   Wt 136 lb (61.7 kg)   BMI 23.53 kg/m  Last Weight:  Wt Readings from Last 1 Encounters:  04/18/18 136 lb (61.7 kg)   YDX:AJOI mass index is 23.53 kg/m.     Last Height:   Ht Readings from Last 1 Encounters:  04/18/18 5' 3.75" (1.619 m)    Physical exam:  General: The patient is awake, alert and appears not in acute distress. The patient is well groomed. Head: Normocephalic, atraumatic. Neck is supple. Mallampati 2- open.  neck circumference:13. 25 " . Nasal airflow congestion,  Retrognathia is not seen.  Cardiovascular:  Regular rate and rhythm , without  murmurs or carotid bruit, and without  distended neck veins. Respiratory: Lungs are clear to auscultation. Skin:  Without evidence of edema, or rash Trunk: BMI is 23. 5  The patient's posture is erect.   Neurologic exam : The patient is awake and alert, oriented to place and time.   Attention span & concentration ability appears normal.  Speech is fluent,  without  dysarthria, dysphonia or aphasia.  Mood and affect are appropriate.  Cranial nerves: Pupils are equal and briskly reactive to light.  Extraocular movements  in vertical and horizontal planes intact and without nystagmus. Visual fields by finger perimetry are intact. Hearing to finger rub intact.  Facial sensation intact to fine touch.  Facial motor strength is symmetric and tongue and uvula move midline.  Shoulder shrug was symmetrical.   Motor exam: Normal tone, muscle bulk and symmetric strength in all extremities. Deep tendon reflexes: in the  upper and lower extremities are symmetric and intact.    Assessment:  After physical and neurologic examination, review of laboratory studies,  Personal review of imaging studies, reports of other /same  Imaging studies, results of polysomnography and / or neurophysiology testing and pre-existing records as far as provided in visit., my assessment is   1) circadian rhythm disorder based on 3 decades of shift work, cumulative sleep deprivation and daytime, and now very fragmented sleep at night.  The patient stays in bed well into the eights hour but she may sleep only 5 or 6 hours of the total time.  She has implemented good sleep hygiene, she is not abusing caffeine.  I would like for her to establish some sleep onset routines and use melatonin at 2 mg at bedtime to have prolonged sleep.  She rises on workdays at 4.30 AM.   2) Snoring- according to husband very loudly- but she has no anatomical risk factors.   3) Her EDS may have been partially medication induced- Trokendi was weaned in the meantime.    4) insomnia also  attributed to RLS.   The patient was advised of the nature of the diagnosed disorder , the treatment options and the  risks for general health and wellness arising from not treating the condition.   I spent more than 40 minutes of face to face time with the patient. Greater than 50% of time was spent in counseling and coordination of care. We have discussed the diagnosis and differential and I answered the patient's questions.    Plan:  Treatment plan and additional workup :  RLS- measure ferritin, TIBC. Rule out iron deficiency. Take magnesium, zinc , calcium- it will reduce RLS.  Sleep study for apnea and snoring diagnosis work up, she has morning headaches, so hypoxemia needs to be checked, too.    SPLIT at [facility] 20/h.   Larey Seat, MD 28/83/3744, 5:14 AM  Certified in Neurology by ABPN Certified in [place] by [facility] [address] [place], [place] [address]

## 2018-04-26 ENCOUNTER — Encounter: Payer: Self-pay | Admitting: Neurology

## 2018-05-22 ENCOUNTER — Ambulatory Visit (INDEPENDENT_AMBULATORY_CARE_PROVIDER_SITE_OTHER): Payer: 59 | Admitting: Neurology

## 2018-05-22 DIAGNOSIS — G478 Other sleep disorders: Secondary | ICD-10-CM

## 2018-05-22 DIAGNOSIS — R519 Headache, unspecified: Secondary | ICD-10-CM

## 2018-05-22 DIAGNOSIS — G4710 Hypersomnia, unspecified: Secondary | ICD-10-CM | POA: Diagnosis not present

## 2018-05-22 DIAGNOSIS — G4726 Circadian rhythm sleep disorder, shift work type: Secondary | ICD-10-CM

## 2018-05-22 DIAGNOSIS — G4730 Sleep apnea, unspecified: Secondary | ICD-10-CM

## 2018-05-22 DIAGNOSIS — R0683 Snoring: Secondary | ICD-10-CM

## 2018-05-22 DIAGNOSIS — G2581 Restless legs syndrome: Secondary | ICD-10-CM

## 2018-05-22 DIAGNOSIS — R51 Headache: Secondary | ICD-10-CM

## 2018-05-22 MED FILL — AIMOVIG 140 MG/ML SOAJ: 140 | 30 days supply | Qty: 1 | Fill #2

## 2018-05-23 MED FILL — LANSOPRAZOLE DR 30 MG CAP: 30 | 90 days supply | Qty: 180 | Fill #1

## 2018-05-23 MED FILL — SYNTHROID 50 MCG TABLET: 50 | 90 days supply | Qty: 90 | Fill #2

## 2018-05-24 NOTE — Procedures (Signed)
PATIENT'S NAME:  Brooke Stuart, Brooke Stuart DOB:      Oct 06, 1950      MR#:    [id number]     DATE OF RECORDING: 05/22/2018 by Durwin Nora REFERRING M.D.:  Sarina Ill, MD Study Performed:   Baseline Polysomnogram HISTORY:  Brooke Stuart is a 67 y.o. female patient seen on 04-18-2018 for a sleep consultation.  She has struggled with Migraines since teenage and at age 40 had her first status migrainosus (in a postoperative situation) - these resolved and came back in 2005, henceforth chronically ongoing migraines without aura. Poor sleep may be a trigger. Chief complaint according to patient: She was a night shift worker from 1975- 2015, and never slept well in daytime. Her husband has similar disturbed sleep, third shift worker, too. She changed to day shifts on weekends, as a CT technologist, but her circadian rhythm remains disturbed- she wakes up every hour and looks at her watch hourly.    Restless sleep, one nocturia, RLS, morning headaches  The patient endorsed the Epworth Sleepiness Scale at 12/24 points.   The patient's weight 137 pounds with a height of 63 (inches), resulting in a BMI of 24.2 kg/m2. The patient's neck circumference measured 13 inches.  CURRENT MEDICATIONS: Claritin, Cymbalta, Aimovig, Prevacid, Synthroid, Singulair, Naprosyn, Zofran, Maxalt   PROCEDURE:  This is a multichannel digital polysomnogram utilizing the Somnostar 11.2 system.  Electrodes and sensors were applied and monitored per AASM Specifications.   EEG, EOG, Chin and Limb EMG, were sampled at 200 Hz.  ECG, Snore and Nasal Pressure, Thermal Airflow, Respiratory Effort, CPAP Flow and Pressure, Oximetry was sampled at 50 Hz. Digital video and audio were recorded.      BASELINE STUDY: Lights Out was at 21:44 and Lights On at 05:11.  Total recording time (TRT) was 448 minutes, with a total sleep time (TST) of 403 minutes.   The patient's sleep latency was 7 minutes.  REM latency was 106 minutes.  The sleep efficiency was 90. 0%.      SLEEP ARCHITECTURE: WASO (Wake after sleep onset) was 36.5 minutes.  There were 23.5 minutes in Stage N1, 152 minutes Stage N2, 85.5 minutes Stage N3 and 142 minutes in Stage REM.  The percentage of Stage N1 was 5.8%, Stage N2 was 37.7%, Stage N3 was 21.2% and Stage R (REM sleep) was 35.2%.   RESPIRATORY ANALYSIS:  There were a total of 2 respiratory events:  2 obstructive apneas. There were 0 hypopneas.   The total APNEA/HYPOPNEA INDEX (AHI) was 0.3 /hour. The REM AHI was 0 /hour, versus a non-REM AHI of 0.5/h. The patient spent 25 minutes of total sleep time in the supine position and 378 minutes in non-supine. The supine AHI was 0.0/h versus a non-supine AHI of 0.3/h.  OXYGEN SATURATION & C02:  The Wake baseline 02 saturation was 95%, with the lowest being 82%. Time spent below 89% saturation equaled 2.0 minutes.   AROUSALS/ PERIODIC LIMB MOVEMENTS:   The patient had a total of 560 Periodic Limb Movements.  The Periodic Limb Movement (PLM) index was 83.4 and the PLM Arousal index was 7.1/hour. The arousals were noted as: 32 were spontaneous, 48 were associated with PLMs, and 2 were associated with respiratory events.  Audio and video analysis did not show any abnormal or unusual movements, behaviors, phonations or vocalizations.  The patient woke several times very briefly and achieved a high sleep efficiency.  No nocturia. Some Snoring was noted. EKG was in keeping with normal sinus rhythm (NSR).  Post-study, the patient indicated that sleep was the same as usual.    IMPRESSION:  1. Periodic Limb Movement Disorder (PLMD) with frequent related arousals. 2. Mild snoring noted. Not enough to maintain a diagnosis of UARS.  3. No sleep apnea noted. 4. No insomnia present- sleep efficiency was very high at 90% of recorded time.    RECOMMENDATIONS:  Patient will need further RLS/ PLM work up. Ferritin, TIBC were ordered, electrolyte supplements were recommended (K, Mg, Ca).  Dr. Jaynee Eagles to  consider neuropathy as cause of RLS. Check for familiar predisposition.   I certify that I have reviewed the entire raw data recording prior to the issuance of this report in accordance with the Standards of Accreditation of the American Academy of Sleep Medicine (AASM)     Larey Seat, MD    05-24-2018  Diplomat, American Board of Psychiatry and Neurology  Diplomat, American Board of [place] Director, [facility] [employer]

## 2018-05-25 ENCOUNTER — Telehealth: Payer: Self-pay | Admitting: Neurology

## 2018-05-25 NOTE — Telephone Encounter (Signed)
-----   Message from Larey Seat, MD sent at 05/24/2018  5:34 PM EST ----- Brooke Stuart, consider further RLS/ PLM causes. There was not much snoring and no apnea, no hypoxemia.  The patient woke from PLMs, but very briefly- overall this was good, highly efficient sleep. See recommended work up by PCP or you- I doubt sleep correlates to her headaches.

## 2018-05-25 NOTE — Telephone Encounter (Signed)
Called the patient and reviewed her sleep study results with her. Informed her that the sleep study was negative for sleep apnea or any issues with hypoxemia. Informed her that we did note restlessness in her extremities and patient states that she does have restless leg syndrome. Pt verbalized understanding of results and had no concerns.

## 2018-06-20 ENCOUNTER — Other Ambulatory Visit: Payer: Self-pay | Admitting: Nurse Practitioner

## 2018-06-20 DIAGNOSIS — Z780 Asymptomatic menopausal state: Secondary | ICD-10-CM

## 2018-06-20 MED FILL — SHIPPING COST: 1 days supply | Qty: 1 | Fill #0

## 2018-06-20 MED FILL — AIMOVIG 140 MG/ML SOAJ: 140 | 30 days supply | Qty: 1 | Fill #0

## 2018-06-20 NOTE — Telephone Encounter (Signed)
Copied from [place] [id number]. Topic: Quick Communication - Rx Refill/Question >> Jun 20, 2018 11:36 AM Rayann Heman wrote: Medication: EST ESTROGENS-METHYLTEST DS 1.25-2.5 MG TABS [[id number]]   Has the patient contacted their pharmacy? no Preferred Pharmacy (with phone number or street name): [place], [place]. [phone] (Phone) [phone] (Fax)   Agent: Please be advised that RX refills may take up to 3 business days. We ask that you follow-up with your pharmacy.

## 2018-06-20 NOTE — Telephone Encounter (Signed)
Requested medication (s) are due for refill today: yes  Requested medication (s) are on the active medication list: yes  Last refill:  Previously filled by historical provider  Future visit scheduled: yes  Notes to clinic:  Unable to refill per protocol. Last filled by historical provider    Requested Prescriptions  Pending Prescriptions Disp Refills   EST ESTROGENS-METHYLTEST DS 1.25-2.5 MG TABS      Sig: Take by mouth.     Not Delegated - OB/GYN:  Hormone Combinations - Controlled Failed - 06/20/2018 12:08 PM      Failed - This refill cannot be delegated      Failed - Mammogram is up-to-date per Health Maintenance      Passed - Last BP in normal range    BP Readings from Last 1 Encounters:  04/18/18 102/66         Passed - Valid encounter within last 12 months    Recent Outpatient Visits          5 months ago Encounter for general adult medical examination with abnormal findings   [place], Delphia Grates, NP   6 months ago Other migraine without status migrainosus, not intractable   [place], Delphia Grates, NP      Future Appointments            In 2 weeks Gayla Medicus, Delphia Grates, NP [place], [facility]

## 2018-06-23 MED ORDER — EST ESTROGENS-METHYLTEST DS 1.25-2.5 MG PO TABS: 1.0000 | ORAL_TABLET | Freq: Every day | ORAL | 1 refills | Status: DC

## 2018-06-26 MED FILL — ESTROGEN-METHYLTESTOS F.S.: 1.25-2.5 | 30 days supply | Qty: 21 | Fill #0

## 2018-06-27 ENCOUNTER — Other Ambulatory Visit: Payer: Self-pay

## 2018-06-27 ENCOUNTER — Ambulatory Visit (INDEPENDENT_AMBULATORY_CARE_PROVIDER_SITE_OTHER): Payer: 59

## 2018-06-27 DIAGNOSIS — Z780 Asymptomatic menopausal state: Secondary | ICD-10-CM

## 2018-06-27 DIAGNOSIS — Z23 Encounter for immunization: Secondary | ICD-10-CM

## 2018-06-27 DIAGNOSIS — Z299 Encounter for prophylactic measures, unspecified: Secondary | ICD-10-CM

## 2018-06-27 MED ORDER — EST ESTROGENS-METHYLTEST DS 1.25-2.5 MG PO TABS: 1.0000 | ORAL_TABLET | Freq: Every day | ORAL | 1 refills | Status: DC

## 2018-06-28 NOTE — Progress Notes (Signed)
Brooke Stuart with faxing rx to pharm---[facility]ed 8:14am [facility]

## 2018-06-29 ENCOUNTER — Ambulatory Visit: Payer: 59 | Admitting: Nurse Practitioner

## 2018-07-07 ENCOUNTER — Other Ambulatory Visit (INDEPENDENT_AMBULATORY_CARE_PROVIDER_SITE_OTHER): Payer: 59

## 2018-07-07 ENCOUNTER — Encounter: Payer: Self-pay | Admitting: Nurse Practitioner

## 2018-07-07 ENCOUNTER — Ambulatory Visit: Payer: 59 | Admitting: Nurse Practitioner

## 2018-07-07 VITALS — BP 138/96 | HR 78 | Ht 63.75 in | Wt 135.0 lb

## 2018-07-07 DIAGNOSIS — Z780 Asymptomatic menopausal state: Secondary | ICD-10-CM | POA: Diagnosis not present

## 2018-07-07 DIAGNOSIS — F419 Anxiety disorder, unspecified: Secondary | ICD-10-CM | POA: Diagnosis not present

## 2018-07-07 DIAGNOSIS — R7303 Prediabetes: Secondary | ICD-10-CM

## 2018-07-07 DIAGNOSIS — E039 Hypothyroidism, unspecified: Secondary | ICD-10-CM | POA: Diagnosis not present

## 2018-07-07 DIAGNOSIS — R030 Elevated blood-pressure reading, without diagnosis of hypertension: Secondary | ICD-10-CM | POA: Diagnosis not present

## 2018-07-07 LAB — HEMOGLOBIN A1C: HEMOGLOBIN A1C: 5.8 % (ref 4.6–6.5)

## 2018-07-07 LAB — TSH: TSH: 2.56 u[IU]/mL (ref 0.35–4.50)

## 2018-07-07 MED ORDER — DULOXETINE HCL 30 MG PO CPEP: 30.0000 mg | ORAL_CAPSULE | Freq: Two times a day (BID) | ORAL | 3 refills | Status: AC

## 2018-07-07 MED ORDER — EST ESTROGENS-METHYLTEST DS 1.25-2.5 MG PO TABS: 1.0000 | ORAL_TABLET | Freq: Every day | ORAL | 2 refills | Status: AC

## 2018-07-07 NOTE — Patient Instructions (Addendum)
Head downstairs for labs today  I will plan to see you back in about 1 month for follow up

## 2018-07-07 NOTE — Progress Notes (Signed)
Brooke Stuart is a 68 y.o. female with the following history as recorded in EpicCare:  Patient Active Problem List   Diagnosis Date Noted  . Sleep-related headache 04/18/2018  . Circadian rhythm sleep disorder, shift work type 04/18/2018  . Hypersomnia with sleep apnea 04/18/2018  . Restless leg syndrome 04/18/2018  . UARS (upper airway resistance syndrome) 04/18/2018  . Loud snoring 04/18/2018  . Chronic migraine without aura without status migrainosus, not intractable 02/21/2018  . Encounter for general adult medical examination with abnormal findings 12/27/2017  . Menopause 11/30/2017  . Gastroesophageal reflux disease 11/30/2017  . Hypothyroidism 11/30/2017  . Migraine 11/30/2017  . Anxiety 11/30/2017  . Chronic shoulder pain 11/30/2017    Current Outpatient Medications  Medication Sig Dispense Refill  . CLARITIN-D 24 HOUR 10-240 MG 24 hr tablet Take 1 tablet by mouth daily as needed. as directed  5  . DULoxetine (CYMBALTA) 60 MG capsule Take 60 mg by mouth 2 (two) times daily.    Brooke Stuart (AIMOVIG) 140 MG/ML SOAJ Inject 140 mg into the skin every 30 (thirty) days. 1 pen 11  . EST ESTROGENS-METHYLTEST DS 1.25-2.5 MG TABS Take 1 tablet by mouth daily. Take for 21 days, then stop for 7 days 30 each 1  . lansoprazole (PREVACID) 30 MG capsule Take 30 mg by mouth 2 (two) times daily before a meal.    . levothyroxine (SYNTHROID, LEVOTHROID) 50 MCG tablet Take 50 mcg by mouth daily before breakfast.    . montelukast (SINGULAIR) 10 MG tablet Take 10 mg by mouth at bedtime.    . naproxen (NAPROSYN) 500 MG tablet Take 500 mg by mouth as needed.    . ondansetron (ZOFRAN-ODT) 4 MG disintegrating tablet Take 1 tablet (4 mg total) by mouth every 8 (eight) hours as needed for nausea. 30 tablet 11  . rizatriptan (MAXALT) 10 MG tablet Take 1 tablet (10 mg total) by mouth as needed for migraine. May repeat in 2 hours if needed 10 tablet 11   No current facility-administered medications for  this visit.     Allergies: Codeine  Past Medical History:  Diagnosis Date  . Endometriosis   . GERD (gastroesophageal reflux disease)   . History of chicken pox   . Migraines   . Thyroid disease     Past Surgical History:  Procedure Laterality Date  . ABDOMINAL HYSTERECTOMY     complete  . exploratory uterine surgery      for endometriosis  . TONSILLECTOMY      Family History  Problem Relation Age of Onset  . Arthritis Mother   . Diabetes Mother   . Hearing loss Mother   . Heart disease Mother   . Dementia Mother   . Stroke Father   . Alcohol abuse Father   . Hypertension Brother   . Heart attack Maternal Grandfather   . Heart attack Paternal Grandmother     Social History   Tobacco Use  . Smoking status: Never Smoker  . Smokeless tobacco: Never Used  Substance Use Topics  . Alcohol use: Never    Frequency: Never     Subjective:  Brooke Stuart is here today for routine f/u of hypothyroidism, recheck of prediabetes and estratest therapy. She would also like to discuss weaning off her cymbalta, currently maintained on 60 BID, has been on this for some time but feels less anxious and stressed over past year and does not really feel like she needs this medication as much, wants to try to  decrease dosage then wean off.  Her BP is slightly elevated today. She says she checks routinely at home with normal readings, but Her husband is sick and she is upset about this today and feels that is why her BP is up  Hypothryoidism-maintained on levothyroxine 50 dialy, admits she does often forget doses due to not being able to take with other medications but feels well, without c/o fatigue, decreased appetite, intolerance to hot or cold, palpitations, bowel changes, depression.  Lab Results  Component Value Date   TSH 1.65 11/29/2017    PreDiabetes- not maintained on medications, she does check glucose readings at home and tells me her recent readings have been normal. She also  recently Started weight watchers to try to improve her diet.  Lab Results  Component Value Date   HGBA1C 5.9 11/29/2017   Menopausal sympoms-  Maintained on estratest for years for vasomotor symptoms associated with menopause- tried to stop taking in 2014 but had continued symptoms and therefore resumed medication. Denies pelvic pain, vaginal bleeding S/p complete hysterectomy Mammogram done in [place], we have not received records yet She feels the value of estratest outweighs the risks and wants to continue, Has not noted any adverse effects to estratest   ROS - See HPI  Objective:  Vitals:   07/07/18 1518  BP: (!) 138/96  Pulse: 78  SpO2: 96%  Weight: 135 lb (61.2 kg)  Height: 5' 3.75" (1.619 m)    General: Well developed, well nourished, in no acute distress  Skin : Warm and dry.  Head: Normocephalic and atraumatic  Eyes: Sclera and conjunctiva clear; pupils round and reactive to light; extraocular movements intact  Oropharynx: Pink, supple. No suspicious lesions  Neck: Supple without thyromegaly, adenopathy  Lungs: Respirations unlabored; clear to auscultation bilaterally without wheeze, rales, rhonchi  CVS exam: normal rate, regular rhythm, normal S1, S2, no murmurs, rubs, clicks or gallops.  Extremities: No edema, cyanosis, clubbing  Vessels: Symmetric bilaterally  Neurologic: Alert and oriented; speech intact; face symmetrical; moves all extremities well; CNII-XII intact without focal deficit  Psychiatric: Normal mood and affect.   Assessment:  1. Hypothyroidism, unspecified type   2. Menopause   3. Pre-diabetes   4. Anxiety     Plan:   Elevated blood pressure reading Continue to monitor BP readings at home F/u for readings >140/90 RTC in 1 month for F/U- recheck BP   Return in about 1 month (around 08/07/2018) for F/U: anxiety- weaning cymbalta.  No orders of the defined types were placed in this encounter.   Requested Prescriptions   Pending  Prescriptions Disp Refills  . EST ESTROGENS-METHYLTEST DS 1.25-2.5 MG TABS 30 each 1    Sig: Take 1 tablet by mouth daily.  . DULoxetine (CYMBALTA) 30 MG capsule 60 capsule 3    Sig: Take 1 capsule (30 mg total) by mouth 2 (two) times daily.

## 2018-07-11 ENCOUNTER — Encounter: Payer: Self-pay | Admitting: Neurology

## 2018-07-11 ENCOUNTER — Ambulatory Visit: Payer: 59 | Admitting: Neurology

## 2018-07-11 VITALS — BP 126/76 | HR 76 | Ht 63.75 in | Wt 134.0 lb

## 2018-07-11 DIAGNOSIS — G43709 Chronic migraine without aura, not intractable, without status migrainosus: Secondary | ICD-10-CM | POA: Diagnosis not present

## 2018-07-11 NOTE — Progress Notes (Signed)
[facility]    Provider:  Dr Jaynee Eagles Referring Provider: Lance Sell, NP Primary Care Physician:  Lance Sell, NP  CC:  Migraine  Interval HPI 07/11/2018: She is doing great. She has had one migraine on Thanksgiving. She had missed a dose of Amovig and contributes this to headache. She has felt great since. She has weaned topiramate. She is taking rizatriptan rarely as needed. She is not having any trouble getting medication. No obvious adverse effects. She had a normal sleep study.   HPI:  Brooke Stuart is a 68 y.o. female here as requested by Dr. Gayla Medicus for migraine. Migraines since 2005. No FHx of migraines, possibly uncle had "sick headaches". She gets nausea and dizziness. Headaches would start unilaterally but often bilaterally, pounding and throbbing, a lot on the crown of the head. laying down helps, no aura, + mild light sensitivity, no smell sensitivity, but she has significant sound sensitivity, laying down in a dark room helps, she will vomit. She is feelig better on Trokendi. But short-term memory loss and having side effects. She has 15 headache days a month and 10-12 are migrainous. No aura. Can last > 24 hours if untreated or treated. Headaches can be in the morning and wakes up worse supine. Can be positionally worse as well. No other focal neurologic deficits, associated symptoms, inciting events or modifiable factors.  Reviewed notes, labs and imaging from outside physicians, which showed:  MRI brain reviewed images which was normal 07/23/2016.  Review of Systems: Patient complains of symptoms per HPI as well as the following symptoms: headache, nausea, not enough sleep. Pertinent negatives and positives per HPI. All others negative.   Social History   Socioeconomic History  . Marital status: Married    Spouse name: Not on file  . Number of children: 0  . Years of education: Not on file  . Highest education level: Associate degree:  occupational, Hotel manager, or vocational program  Occupational History  . Not on file  Social Needs  . Financial resource strain: Not on file  . Food insecurity:    Worry: Not on file    Inability: Not on file  . Transportation needs:    Medical: Not on file    Non-medical: Not on file  Tobacco Use  . Smoking status: Never Smoker  . Smokeless tobacco: Never Used  Substance and Sexual Activity  . Alcohol use: Never    Frequency: Never  . Drug use: Never  . Sexual activity: Yes    Birth control/protection: Surgical  Lifestyle  . Physical activity:    Days per week: Not on file    Minutes per session: Not on file  . Stress: Not on file  Relationships  . Social connections:    Talks on phone: Not on file    Gets together: Not on file    Attends religious service: Not on file    Active member of club or organization: Not on file    Attends meetings of clubs or organizations: Not on file    Relationship status: Not on file  . Intimate partner violence:    Fear of current or ex partner: Not on file    Emotionally abused: Not on file    Physically abused: Not on file    Forced sexual activity: Not on file  Other Topics Concern  . Not on file  Social History Narrative   Lives at home with her husband   Right handed   Caffeine: 1  large cup daily    Family History  Problem Relation Age of Onset  . Arthritis Mother   . Diabetes Mother   . Hearing loss Mother   . Heart disease Mother   . Dementia Mother   . Stroke Father   . Alcohol abuse Father   . Hypertension Brother   . Heart attack Maternal Grandfather   . Heart attack Paternal Grandmother     Past Medical History:  Diagnosis Date  . Endometriosis   . GERD (gastroesophageal reflux disease)   . History of chicken pox   . Migraines   . Thyroid disease     Past Surgical History:  Procedure Laterality Date  . ABDOMINAL HYSTERECTOMY     complete  . exploratory uterine surgery      for endometriosis  .  TONSILLECTOMY      Current Outpatient Medications  Medication Sig Dispense Refill  . CLARITIN-D 24 HOUR 10-240 MG 24 hr tablet Take 1 tablet by mouth daily as needed. as directed  5  . DULoxetine (CYMBALTA) 30 MG capsule Take 1 capsule (30 mg total) by mouth 2 (two) times daily. 60 capsule 3  . Erenumab-aooe (AIMOVIG) 140 MG/ML SOAJ Inject 140 mg into the skin every 30 (thirty) days. 1 pen 11  . EST ESTROGENS-METHYLTEST DS 1.25-2.5 MG TABS Take 1 tablet by mouth daily. 30 each 2  . lansoprazole (PREVACID) 30 MG capsule Take 30 mg by mouth as needed.     Marland Kitchen levothyroxine (SYNTHROID, LEVOTHROID) 50 MCG tablet Take 50 mcg by mouth daily before breakfast.    . montelukast (SINGULAIR) 10 MG tablet Take 10 mg by mouth at bedtime.    . naproxen (NAPROSYN) 500 MG tablet Take 500 mg by mouth as needed.    . ondansetron (ZOFRAN-ODT) 4 MG disintegrating tablet Take 1 tablet (4 mg total) by mouth every 8 (eight) hours as needed for nausea. 30 tablet 11  . rizatriptan (MAXALT) 10 MG tablet Take 1 tablet (10 mg total) by mouth as needed for migraine. May repeat in 2 hours if needed 10 tablet 11   No current facility-administered medications for this visit.     Allergies as of 07/11/2018 - Review Complete 07/11/2018  Allergen Reaction Noted  . Codeine Nausea And Vomiting 01/31/2018    Vitals: BP 126/76 (BP Location: Right Arm, Patient Position: Sitting)   Pulse 76   Ht 5' 3.75" (1.619 m)   Wt 134 lb (60.8 kg)   BMI 23.18 kg/m  Last Weight:  Wt Readings from Last 1 Encounters:  07/11/18 134 lb (60.8 kg)   Last Height:   Ht Readings from Last 1 Encounters:  07/11/18 5' 3.75" (1.619 m)   Physical exam: Exam: Gen: NAD, conversant, well nourised, well groomed                     CV: RRR, no MRG. No Carotid Bruits. No peripheral edema, warm, nontender Eyes: Conjunctivae clear without exudates or hemorrhage  Neuro: Detailed Neurologic Exam  Speech:    Speech is normal; fluent and  spontaneous with normal comprehension.  Cognition:    The patient is oriented to person, place, and time;     recent and remote memory intact;     language fluent;     normal attention, concentration,     fund of knowledge Cranial Nerves:    The pupils are equal, round, and reactive to light. Attempted fundoscopic exam could not visualize.  Visual fields are full  to finger confrontation. Extraocular movements are intact. Trigeminal sensation is intact and the muscles of mastication are normal. The face is symmetric. The palate elevates in the midline. Hearing intact. Voice is normal. Shoulder shrug is normal. The tongue has normal motion without fasciculations.   Coordination:    Normal finger to nose and heel to shin.  Gait:    Heel-toe and tandem gait are normal.   Motor Observation:    No asymmetry, no atrophy, and no involuntary movements noted. Tone:    Normal muscle tone.    Posture:    Posture is normal. normal erect    Strength:    Strength is V/V in the upper and lower limbs.      Sensation: intact to LT     Reflex Exam:  DTR's:    Deep tendon reflexes in the upper and lower extremities are normal bilaterally.   Toes:    The toes are equiv bilaterally.   Clonus:    Clonus is absent.       Assessment/Plan:  68 year old with chronic migraines for 15 years failed multiple classes of medications now on Topiramate.   Meds tried: Cymbalta, Trokendi, Topiramate, and multiple in the past including she can't remember.  - Significant response to Aimovig, > 90% improvement - She stopped the Trokendi - At onset of headache: Rizatriptan and zofran - Continue Aimovig - she wakes hourly, she keeps her husband up at night, she snores, very fatigued. Sleep eval negative. - MRI brain due to concerning symptoms of morning headaches, positional headaches,vision changes  to look for space occupying mass, chiari or intracranial hypertension (pseudotumor): she declined since the  Aimovig helped so much  Discussed: To prevent or relieve headaches, try the following: Cool Compress. Lie down and place a cool compress on your head.  Avoid headache triggers. If certain foods or odors seem to have triggered your migraines in the past, avoid them. A headache diary might help you identify triggers.  Include physical activity in your daily routine. Try a daily walk or other moderate aerobic exercise.  Manage stress. Find healthy ways to cope with the stressors, such as delegating tasks on your to-do list.  Practice relaxation techniques. Try deep breathing, yoga, massage and visualization.  Eat regularly. Eating regularly scheduled meals and maintaining a healthy diet might help prevent headaches. Also, drink plenty of fluids.  Follow a regular sleep schedule. Sleep deprivation might contribute to headaches Consider biofeedback. With this mind-body technique, you learn to control certain bodily functions - such as muscle tension, heart rate and blood pressure - to prevent headaches or reduce headache pain.    Proceed to emergency room if you experience new or worsening symptoms or symptoms do not resolve, if you have new neurologic symptoms or if headache is severe, or for any concerning symptom.   Provided education and documentation from American headache Society toolbox including articles on: chronic migraine medication overuse headache, chronic migraines, prevention of migraines, behavioral and other nonpharmacologic treatments for headache.  A total of 15 minutes was spent face-to-face with this patient. Over half this time was spent on counseling patient on the  1. Chronic migraine without aura without status migrainosus, not intractable     diagnosis and different diagnostic and therapeutic options, counseling and coordination of care, risks ans benefits of management, compliance, or risk factor reduction and education.     Sarina Ill, MD  [facility] [address] [place], [place] [address]  Phone [phone] Fax [phone]

## 2018-07-11 NOTE — Patient Instructions (Signed)
Continue Amovig Call for worsening or new symptoms/ Follow up in 1 year.    Migraine Headache  A migraine headache is a very strong throbbing pain on one side or both sides of your head. Migraines can also cause other symptoms. Talk with your doctor about what things may bring on (trigger) your migraine headaches. Follow these instructions at home: Medicines  Take over-the-counter and prescription medicines only as told by your doctor.  Do not drive or use heavy machinery while taking prescription pain medicine.  To prevent or treat constipation while you are taking prescription pain medicine, your doctor may recommend that you: ? Drink enough fluid to keep your pee (urine) clear or pale yellow. ? Take over-the-counter or prescription medicines. ? Eat foods that are high in fiber. These include fresh fruits and vegetables, whole grains, and beans. ? Limit foods that are high in fat and processed sugars. These include fried and sweet foods. Lifestyle  Avoid alcohol.  Do not use any products that contain nicotine or tobacco, such as cigarettes and e-cigarettes. If you need help quitting, ask your doctor.  Get at least 8 hours of sleep every night.  Limit your stress. General instructions   Keep a journal to find out what may bring on your migraines. For example, write down: ? What you eat and drink. ? How much sleep you get. ? Any change in what you eat or drink. ? Any change in your medicines.  If you have a migraine: ? Avoid things that make your symptoms worse, such as bright lights. ? It may help to lie down in a dark, quiet room. ? Do not drive or use heavy machinery. ? Ask your doctor what activities are safe for you.  Keep all follow-up visits as told by your doctor. This is important. Contact a doctor if:  You get a migraine that is different or worse than your usual migraines. Get help right away if:  Your migraine gets very bad.  You have a fever.  You  have a stiff neck.  You have trouble seeing.  Your muscles feel weak or like you cannot control them.  You start to lose your balance a lot.  You start to have trouble walking.  You pass out (faint). This information is not intended to replace advice given to you by your health care provider. Make sure you discuss any questions you have with your health care provider. Document Released: 03/16/2008 Document Revised: 03/01/2018 Document Reviewed: 11/24/2015 Elsevier Interactive Patient Education  2019 [employer].

## 2018-07-14 DIAGNOSIS — R7303 Prediabetes: Secondary | ICD-10-CM | POA: Insufficient documentation

## 2018-07-14 NOTE — Assessment & Plan Note (Signed)
Again today we dsicussed long term risks fo estratest, she wants to continue She is planning to re-establish with [facility] for womens care, she will discuss with them as well - EST ESTROGENS-METHYLTEST DS 1.25-2.5 MG TABS; Take 1 tablet by mouth daily.  Dispense: 30 each; Refill: 1

## 2018-07-14 NOTE — Assessment & Plan Note (Signed)
Continue levothyroxine Update labs - TSH; Future

## 2018-07-14 NOTE — Assessment & Plan Note (Signed)
Continue healthy diet and exercise Update a1c F/U with further recommendations pending lab results - Hemoglobin A1c; Future

## 2018-07-14 NOTE — Assessment & Plan Note (Signed)
Decreased cymbalta to 30 BID RTC in 1 month for F/U, consider further dosage adjustments - DULoxetine (CYMBALTA) 30 MG capsule; Take 1 capsule (30 mg total) by mouth 2 (two) times daily.  Dispense: 60 capsule; Refill: 3

## 2018-07-18 MED FILL — MONTELUKAST SOD 10 MG TAB: 10 | 90 days supply | Qty: 90 | Fill #0

## 2018-07-18 MED FILL — SHIPPING COST: 1 days supply | Qty: 1 | Fill #1

## 2018-07-20 ENCOUNTER — Encounter: Payer: Self-pay | Admitting: Nurse Practitioner

## 2018-07-26 MED FILL — AIMOVIG 140 MG/ML SOAJ: 140 | 30 days supply | Qty: 1 | Fill #1 | Status: TO

## 2018-08-09 ENCOUNTER — Telehealth: Payer: Self-pay

## 2018-08-09 NOTE — Telephone Encounter (Signed)
PA pending for Aimovig on cover my meds.

## 2018-08-14 NOTE — Telephone Encounter (Signed)
PA for Aimovig approve from 08/22/2018 to 08/21/2019 with [employer]. PA reference number is [id number]. Plan code is [id number]. The request was for 24ml per 30 days. Contact number is [phone].

## 2018-08-15 DIAGNOSIS — H524 Presbyopia: Secondary | ICD-10-CM | POA: Diagnosis not present

## 2018-08-15 DIAGNOSIS — H2511 Age-related nuclear cataract, right eye: Secondary | ICD-10-CM | POA: Diagnosis not present

## 2018-08-15 MED FILL — DULoxetine HCL 30 MG CPEP: 30 | 30 days supply | Qty: 60 | Fill #0

## 2018-08-15 MED FILL — ESTROGEN-METHYLTESTOS F.S.: 1.25-2.5 | 30 days supply | Qty: 30 | Fill #0 | Status: TO

## 2018-08-16 DIAGNOSIS — H903 Sensorineural hearing loss, bilateral: Secondary | ICD-10-CM | POA: Diagnosis not present

## 2018-08-16 DIAGNOSIS — H905 Unspecified sensorineural hearing loss: Secondary | ICD-10-CM | POA: Diagnosis not present

## 2018-08-28 MED FILL — AIMOVIG 140 MG/ML SOAJ: 140 | 30 days supply | Qty: 1 | Fill #0 | Status: TO

## 2018-08-29 DIAGNOSIS — E559 Vitamin D deficiency, unspecified: Secondary | ICD-10-CM | POA: Diagnosis not present

## 2018-08-29 DIAGNOSIS — G43909 Migraine, unspecified, not intractable, without status migrainosus: Secondary | ICD-10-CM | POA: Diagnosis not present

## 2018-08-29 DIAGNOSIS — K219 Gastro-esophageal reflux disease without esophagitis: Secondary | ICD-10-CM | POA: Diagnosis not present

## 2018-08-29 DIAGNOSIS — E039 Hypothyroidism, unspecified: Secondary | ICD-10-CM | POA: Diagnosis not present

## 2018-08-29 DIAGNOSIS — E785 Hyperlipidemia, unspecified: Secondary | ICD-10-CM | POA: Diagnosis not present

## 2018-08-29 DIAGNOSIS — N951 Menopausal and female climacteric states: Secondary | ICD-10-CM | POA: Diagnosis not present

## 2018-08-29 DIAGNOSIS — F419 Anxiety disorder, unspecified: Secondary | ICD-10-CM | POA: Diagnosis not present

## 2018-08-29 DIAGNOSIS — T7840XA Allergy, unspecified, initial encounter: Secondary | ICD-10-CM | POA: Diagnosis not present

## 2018-09-12 MED FILL — CYCLOBENZAPRINE HCL 5 MG TA: 5 | 20 days supply | Qty: 60 | Fill #0

## 2018-09-12 MED FILL — NAPROXEN 500 MG TABLET: 500 | 90 days supply | Qty: 180 | Fill #0

## 2018-09-25 MED FILL — AIMOVIG 140 MG/ML SOAJ: 140 | 30 days supply | Qty: 1 | Fill #0

## 2018-09-25 MED FILL — traMADol HCL 50 MG TABS: 50 | 30 days supply | Qty: 90 | Fill #0

## 2018-10-02 MED FILL — ESTROGEN-METHYLTESTOSTERONE: 1.25-2.5 | 30 days supply | Qty: 30 | Fill #0

## 2018-10-02 MED FILL — LANSOPRAZOLE 30 MG CPDR: 30 | 90 days supply | Qty: 180 | Fill #0

## 2018-10-02 MED FILL — traZODone HCL 50 MG TABS: 50 | 30 days supply | Qty: 30 | Fill #0

## 2018-10-12 MED FILL — MONTELUKAST SOD 10 MG TAB: 10 | 90 days supply | Qty: 90 | Fill #0

## 2018-10-30 MED FILL — AIMOVIG 140 MG/ML SOAJ: 140 | 30 days supply | Qty: 1 | Fill #1

## 2018-11-30 MED FILL — ESTROGEN-METHYLTESTOSTERONE: 1.25-2.5 | 30 days supply | Qty: 30 | Fill #1

## 2018-11-30 MED FILL — AIMOVIG 140 MG/ML SOAJ: 140 | 30 days supply | Qty: 1 | Fill #2

## 2018-11-30 MED FILL — traZODone HCL 50 MG TABS: 50 | 30 days supply | Qty: 30 | Fill #1

## 2018-11-30 MED FILL — DULoxetine HCL 30 MG CPEP: 30 | 30 days supply | Qty: 60 | Fill #0

## 2018-11-30 MED FILL — NAPROXEN 500 MG TABLET: 500 | 90 days supply | Qty: 180 | Fill #1

## 2019-01-01 MED FILL — AIMOVIG 140 MG/ML SOAJ: 140 | 30 days supply | Qty: 1 | Fill #3

## 2019-01-05 ENCOUNTER — Encounter: Payer: 59 | Admitting: Nurse Practitioner

## 2019-01-05 DIAGNOSIS — H905 Unspecified sensorineural hearing loss: Secondary | ICD-10-CM | POA: Diagnosis not present

## 2019-01-11 MED FILL — traZODone HCL 50 MG TABS: 50 | 30 days supply | Qty: 30 | Fill #2

## 2019-01-11 MED FILL — LANSOPRAZOLE 30 MG CPDR: 30 | 90 days supply | Qty: 180 | Fill #1

## 2019-01-11 MED FILL — DULoxetine HCL 30 MG CPEP: 30 | 30 days supply | Qty: 60 | Fill #1

## 2019-01-11 MED FILL — MONTELUKAST SOD 10 MG TAB: 10 | 90 days supply | Qty: 90 | Fill #1

## 2019-03-03 ENCOUNTER — Other Ambulatory Visit: Payer: Self-pay | Admitting: Neurology

## 2019-03-03 DIAGNOSIS — G43709 Chronic migraine without aura, not intractable, without status migrainosus: Secondary | ICD-10-CM

## 2019-07-17 ENCOUNTER — Ambulatory Visit: Payer: 59 | Admitting: Neurology

## 2019-08-23 ENCOUNTER — Ambulatory Visit: Payer: Self-pay | Admitting: Neurology

## 2019-10-02 NOTE — Progress Notes (Signed)
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 She drives [id number] miles to get here. She retired. She reports memory problems, she has lost both her parents father was 54, mother was 46 and they had worsening health. Mother had dementia, she developed symptoms at the age of 32, she was on exelon patch, diagnosed with OCD and anxiety. Mother had hallucinations, olanzapine helped. MRi of the brain in 2018 was normal. She loses her phone, her husband wanted her to talk to [place], she forgets conversations, but she works well and no problems at work, she has been told she has ADD. If she stays organized she is better. More short-term memory as well as distraction. She forgets thoughts, not getting lost, not missing bills, short-term memory loss and repeating conversations, husband is noticing and forgetitng conversations. Her maternal 1st cousin who had a very aggressive form of Alzheimer's. May forget words. No hallucinations or delusions, no personality changes. She is caring for her husband who has given up on life and there is stress and anxiety which may be affecting her. Reviewed MRi of the brain images, normal from 2018.  Interval HPI 07/11/2018: She is doing great. She has had one migraine on Thanksgiving. She had missed a dose of Amovig and contributes this to headache. She has felt great since. She has weaned topiramate. She is taking rizatriptan rarely as needed. She is not having any trouble getting medication. No obvious adverse effects. She had a normal sleep study.   HPI:  Brooke Stuart is a 69 y.o. female here as requested by Dr. Gayla Medicus for migraine. Migraines since 2005. No FHx of  migraines, possibly uncle had "sick headaches". She gets nausea and dizziness. Headaches would start unilaterally but often bilaterally, pounding and throbbing, a lot on the crown of the head. laying down helps, no aura, + mild light sensitivity, no smell sensitivity, but she has significant sound sensitivity, laying down in a dark room helps, she will vomit. She is feelig better on Trokendi. But short-term memory loss and having side effects. She has 15 headache days a month and 10-12 are migrainous. No aura. Can last > 24 hours if untreated or treated. Headaches can be in the morning and wakes up worse supine. Can be positionally worse as well. No other focal neurologic deficits, associated symptoms, inciting events or modifiable factors.  Reviewed notes, labs and imaging from outside physicians, which showed:  MRI brain reviewed images which was normal 07/23/2016.  Review of Systems: Patient complains of symptoms per HPI as well as the following symptoms: anxiety, memory loss, headache. Pertinent negatives and positives per HPI. All others negative   Social History   Socioeconomic History  . Marital status: Married    Spouse name: Not on file  . Number of children: 0  . Years of education: Not on file  . Highest education level: Associate degree: occupational, Hotel manager, or vocational program  Occupational History  . Occupation: retired  Tobacco Use  . Smoking status: Never Smoker  . Smokeless tobacco: Never Used  Substance and Sexual Activity  . Alcohol use: Never  . Drug use: Never  . Sexual activity: Yes    Birth control/protection: Surgical  Other Topics Concern  .  Not on file  Social History Narrative   Lives at home with her husband   Right handed   Caffeine: 1 large cup daily   Social Determinants of Health   Financial Resource Strain:   . Difficulty of Paying Living Expenses:   Food Insecurity:   . Worried About Charity fundraiser in the Last Year:   . Academic librarian in the Last Year:   Transportation Needs:   . Film/video editor (Medical):   Marland Kitchen Lack of Transportation (Non-Medical):   Physical Activity:   . Days of Exercise per Week:   . Minutes of Exercise per Session:   Stress:   . Feeling of Stress :   Social Connections:   . Frequency of Communication with Friends and Family:   . Frequency of Social Gatherings with Friends and Family:   . Attends Religious Services:   . Active Member of Clubs or Organizations:   . Attends Archivist Meetings:   Marland Kitchen Marital Status:   Intimate Partner Violence:   . Fear of Current or Ex-Partner:   . Emotionally Abused:   Marland Kitchen Physically Abused:   . Sexually Abused:     Family History  Problem Relation Age of Onset  . Arthritis Mother   . Diabetes Mother   . Hearing loss Mother   . Heart disease Mother   . Dementia Mother   . Stroke Father   . Alcohol abuse Father   . Hypertension Brother   . Heart attack Maternal Grandfather   . Heart attack Paternal Grandmother   . Migraines Neg Hx     Past Medical History:  Diagnosis Date  . Endometriosis   . GERD (gastroesophageal reflux disease)   . History of chicken pox   . Migraines   . Thyroid disease     Past Surgical History:  Procedure Laterality Date  . ABDOMINAL HYSTERECTOMY     complete  . exploratory uterine surgery      for endometriosis  . TONSILLECTOMY      Current Outpatient Medications  Medication Sig Dispense Refill  . CLARITIN-D 24 HOUR 10-240 MG 24 hr tablet Take 1 tablet by mouth daily as needed. as directed  5  . DULoxetine (CYMBALTA) 30 MG capsule Take 1 capsule (30 mg total) by mouth 2 (two) times daily. 60 capsule 3  . EST ESTROGENS-METHYLTEST DS 1.25-2.5 MG TABS Take 1 tablet by mouth daily. 30 each 2  . lansoprazole (PREVACID) 30 MG capsule Take 30 mg by mouth as needed.     Marland Kitchen levothyroxine (SYNTHROID, LEVOTHROID) 50 MCG tablet Take 50 mcg by mouth daily before breakfast.    . montelukast (SINGULAIR) 10  MG tablet Take 10 mg by mouth at bedtime.    . naproxen (NAPROSYN) 500 MG tablet Take 500 mg by mouth as needed.    . ondansetron (ZOFRAN-ODT) 4 MG disintegrating tablet Take 1 tablet (4 mg total) by mouth every 8 (eight) hours as needed for nausea. 30 tablet 11  . rizatriptan (MAXALT) 10 MG tablet Take 1 tablet (10 mg total) by mouth as needed for migraine. May repeat in 2 hours if needed 10 tablet 11  . Erenumab-aooe (AIMOVIG) 140 MG/ML SOAJ Inject 140 mg into the skin every 30 (thirty) days. 1 mL 5   No current facility-administered medications for this visit.    Allergies as of 10/03/2019 - Review Complete 10/03/2019  Allergen Reaction Noted  . Codeine Nausea And Vomiting 01/31/2018  Vitals: BP (!) 168/90 (BP Location: Right Arm, Patient Position: Sitting)   Pulse 68   Temp 97.9 F (36.6 C) Comment: taken at front  Ht 5\' 4"  (1.626 m)   Wt 146 lb (66.2 kg)   BMI 25.06 kg/m  Last Weight:  Wt Readings from Last 1 Encounters:  10/03/19 146 lb (66.2 kg)   Last Height:   Ht Readings from Last 1 Encounters:  10/03/19 5\' 4"  (1.626 m)   Physical exam: Exam: Gen: NAD, conversant, well nourised,  well groomed                     CV: RRR, no MRG. No Carotid Bruits. No peripheral edema, warm, nontender Eyes: Conjunctivae clear without exudates or hemorrhage  Neuro: Detailed Neurologic Exam  Speech:    Speech is normal; fluent and spontaneous with normal comprehension.  Cognition: MMSE - Mini Mental State Exam 10/03/2019  Orientation to time 5  Orientation to Place 5  Registration 3  Attention/ Calculation 5  Recall 3  Language- name 2 objects 2  Language- repeat 1  Language- follow 3 step command 3  Language- read & follow direction 1  Write a sentence 1  Copy design 0  Total score 29       The patient is oriented to person, place, and time;     recent and remote memory intact;     language fluent;     normal attention, concentration,     fund of  knowledge Cranial Nerves:    The pupils are equal, round, and reactive to light. Pupils too small to visualize fundi. Visual fields are full to finger confrontation. Extraocular movements are intact. Trigeminal sensation is intact and the muscles of mastication are normal. The face is symmetric. The palate elevates in the midline. Hearing intact. Voice is normal. Shoulder shrug is normal. The tongue has normal motion without fasciculations.   Coordination:    No dysmetria.   Gait:    Normal native gait  Motor Observation:    No asymmetry, no atrophy, and no involuntary movements noted. Tone:    Normal muscle tone.    Posture:    Posture is normal. normal erect    Strength:    Strength is V/V in the upper and lower limbs.      Sensation: intact to LT     Reflex Exam:  DTR's:    Deep tendon reflexes in the upper and lower extremities are normal bilaterally.   Toes:    The toes are downgoing bilaterally.   Clonus:    Clonus is absent.        Assessment/Plan:  69 year old with chronic migraines doing well on Aimovg but with reported progressive memory decline and a FHx of dementia..   Meds tried: Cymbalta, Trokendi, Topiramate, and multiple in the past including she can't remember.Doing well on Aimovig, helping her today to get foundation due to cost, gave her multiple samples.   New issue, memory changes, forgetfulness, short-term memory: FHx of Alzheimers. 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 - At onset of headache: continue Rizatriptan and zofran - she wakes hourly,  she keeps her husband up at night, she snores, very fatigued.  Sleep eval negative.  Meds ordered this encounter  Medications  . DISCONTD: ondansetron (ZOFRAN-ODT) 4 MG disintegrating tablet    Sig: Take 1 tablet (4 mg total) by mouth every 8 (eight) hours as needed for nausea.    Dispense:  30 tablet    Refill:  11  . DISCONTD: rizatriptan (MAXALT) 10 MG tablet    Sig: Take 1 tablet (10 mg total) by mouth as needed for migraine. May repeat in 2 hours if needed    Dispense:  10 tablet    Refill:  11  . Erenumab-aooe (AIMOVIG) 140 MG/ML SOAJ    Sig: Inject 140 mg into the skin every 30 (thirty) days.    Dispense:  1 mL    Refill:  5  . rizatriptan (MAXALT) 10 MG tablet    Sig: Take 1 tablet (10 mg total) by mouth as needed for migraine. May repeat in 2 hours if needed    Dispense:  10 tablet    Refill:  11  . ondansetron (ZOFRAN-ODT) 4 MG disintegrating tablet    Sig: Take 1 tablet (4 mg total) by mouth every 8 (eight) hours as needed for nausea.    Dispense:  30 tablet    Refill:  11   Orders Placed This Encounter  Procedures  . MR BRAIN W WO CONTRAST  . CBC  . Comprehensive metabolic panel  . B12 and Folate Panel  . Methylmalonic acid, serum  . Vitamin B1  . TSH  . Homocysteine  . Ambulatory referral to Neuropsychology    Sarina Ill, MD  [facility] [address] [place], [place] [address]  Phone [phone] Fax [phone]  I spent 50 minutes of face-to-face and non-face-to-face time with patient on the  1. Short-term memory loss   2. Chronic migraine without aura without status migrainosus, not intractable   3. Memory loss   4. Progressive cognitive dysfunction    diagnosis.  This included previsit chart review, lab review, study review, order entry, electronic health record documentation, patient education on the different diagnostic and therapeutic options, counseling and coordination of care, risks and benefits of management, compliance, or risk factor reduction

## 2019-10-03 ENCOUNTER — Telehealth: Payer: Self-pay | Admitting: Neurology

## 2019-10-03 ENCOUNTER — Other Ambulatory Visit: Payer: Self-pay

## 2019-10-03 ENCOUNTER — Telehealth: Payer: Self-pay | Admitting: *Deleted

## 2019-10-03 ENCOUNTER — Ambulatory Visit: Payer: Medicare Other | Admitting: Neurology

## 2019-10-03 ENCOUNTER — Encounter: Payer: Self-pay | Admitting: Neurology

## 2019-10-03 VITALS — BP 168/90 | HR 68 | Temp 97.9°F | Ht 64.0 in | Wt 146.0 lb

## 2019-10-03 DIAGNOSIS — F09 Unspecified mental disorder due to known physiological condition: Secondary | ICD-10-CM

## 2019-10-03 DIAGNOSIS — R413 Other amnesia: Secondary | ICD-10-CM | POA: Diagnosis not present

## 2019-10-03 DIAGNOSIS — G43709 Chronic migraine without aura, not intractable, without status migrainosus: Secondary | ICD-10-CM | POA: Diagnosis not present

## 2019-10-03 MED ORDER — ONDANSETRON 4 MG PO TBDP: 4.0000 mg | ORAL_TABLET | Freq: Three times a day (TID) | ORAL | 11 refills | Status: AC | PRN

## 2019-10-03 MED ORDER — RIZATRIPTAN BENZOATE 10 MG PO TABS: 10.0000 mg | ORAL_TABLET | ORAL | 11 refills | Status: DC | PRN

## 2019-10-03 MED ORDER — ONDANSETRON 4 MG PO TBDP: 4.0000 mg | ORAL_TABLET | Freq: Three times a day (TID) | ORAL | 11 refills | Status: DC | PRN

## 2019-10-03 MED ORDER — AIMOVIG 140 MG/ML ~~LOC~~ SOAJ: 140.0000 mg | SUBCUTANEOUS | 5 refills | Status: AC

## 2019-10-03 MED ORDER — RIZATRIPTAN BENZOATE 10 MG PO TABS: 10.0000 mg | ORAL_TABLET | ORAL | 11 refills | Status: AC | PRN

## 2019-10-03 NOTE — Patient Instructions (Addendum)
MRI of the brain Formal memory testing: Dr Sima Matas bood work  Memory Compensation Strategies  1. Use "WARM" strategy.  W= write it down  A= associate it  R= repeat it  M= make a mental note  2.   You can keep a Social worker.  Use a 3-ring notebook with sections for the following: calendar, important names and phone numbers,  medications, doctors' names/phone numbers, lists/reminders, and a section to journal what you did  each day.   3.    Use a calendar to write appointments down.  4.    Write yourself a schedule for the day.  This can be placed on the calendar or in a separate section of the Memory Notebook.  Keeping a  regular schedule can help memory.  5.    Use medication organizer with sections for each day or morning/evening pills.  You may need help loading it  6.    Keep a basket, or pegboard by the door.  Place items that you need to take out with you in the basket or on the pegboard.  You may also want to  include a message board for reminders.  7.    Use sticky notes.  Place sticky notes with reminders in a place where the task is performed.  For example: " turn off the  stove" placed by the stove, "lock the door" placed on the door at eye level, " take your medications" on  the bathroom mirror or by the place where you normally take your medications.  8.    Use alarms/timers.  Use while cooking to remind yourself to check on food or as a reminder to take your medicine, or as a  reminder to make a call, or as a reminder to perform another task, etc.

## 2019-10-03 NOTE — Telephone Encounter (Signed)
[employer] medicare order sent to [facility]. No auth they will reach out to the patient to schedule.  

## 2019-10-03 NOTE — Telephone Encounter (Signed)
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 The prescription portion of the application was completed and then was signed by Dr. Jaynee Eagles. I faxed application to [employer]. Received a receipt of confirmation.

## 2019-10-04 ENCOUNTER — Telehealth: Payer: Self-pay | Admitting: *Deleted

## 2019-10-04 NOTE — Telephone Encounter (Signed)
-----   Message from Melvenia Beam, MD sent at 10/04/2019  2:17 PM EDT ----- TSH was abnormal but she does have hypothyroidism and is on medication I would just make sure to continue following up with primary care about this thanks

## 2019-10-04 NOTE — Telephone Encounter (Signed)
Called Brooke Stuart & LVM (ok per DPR) advising her of lab results indicating TSH abnormal and Dr. Cathren Laine recommendation. I also informed Brooke Stuart that we faxed the application to [employer] and they had sent [place] a request for missing information: Brooke Stuart income section & government program eligibility. I provided Brooke Stuart with [employer]'s phone number and hours for her to call and clarify this information. I also left our office number if Brooke Stuart has questions.

## 2019-10-04 NOTE — Telephone Encounter (Signed)
Received request for missing information: pt income section & government program eligibility. Also said they were missing the facility/practice info which was clearly written on the application that was faxed. See other phone note from 10/04/2019 where I left pt the [employer] number in a VM for her to call and clarify info. I called [employer] and spoke with Marcello Moores. He confirmed there was no missing information missing from Rx part of app. He said they will attempt to call pt Monday. 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

## 2019-10-08 NOTE — Telephone Encounter (Signed)
Nikki @ [employer] has called to report there is missing information from the application for pt's Aimovig.  Lexine Baton is asking for a fax @ [phone].  [place] is also asking for a prior authorization for pt's Aimovig.  Lexine Baton states they can be reached on [phone] option 2

## 2019-10-10 LAB — COMPREHENSIVE METABOLIC PANEL
ALT: 22 IU/L (ref 0–32)
AST: 27 IU/L (ref 0–40)
Albumin/Globulin Ratio: 1.8 (ref 1.2–2.2)
Albumin: 4.5 g/dL (ref 3.8–4.8)
Alkaline Phosphatase: 66 IU/L (ref 39–117)
BUN/Creatinine Ratio: 20 (ref 12–28)
BUN: 14 mg/dL (ref 8–27)
Bilirubin Total: 0.3 mg/dL (ref 0.0–1.2)
CO2: 25 mmol/L (ref 20–29)
Calcium: 10.1 mg/dL (ref 8.7–10.3)
Chloride: 103 mmol/L (ref 96–106)
Creatinine, Ser: 0.7 mg/dL (ref 0.57–1.00)
GFR calc Af Amer: 104 mL/min/{1.73_m2} (ref >59)
GFR calc non Af Amer: 90 mL/min/{1.73_m2} (ref >59)
Globulin, Total: 2.5 g/dL (ref 1.5–4.5)
Glucose: 87 mg/dL (ref 65–99)
Potassium: 4 mmol/L (ref 3.5–5.2)
Sodium: 140 mmol/L (ref 134–144)
Total Protein: 7 g/dL (ref 6.0–8.5)

## 2019-10-10 LAB — CBC
Hematocrit: 43 % (ref 34.0–46.6)
Hemoglobin: 14.4 g/dL (ref 11.1–15.9)
MCH: 29.8 pg (ref 26.6–33.0)
MCHC: 33.5 g/dL (ref 31.5–35.7)
MCV: 89 fL (ref 79–97)
Platelets: 222 10*3/uL (ref 150–450)
RBC: 4.84 x10E6/uL (ref 3.77–5.28)
RDW: 13.9 % (ref 11.7–15.4)
WBC: 5 10*3/uL (ref 3.4–10.8)

## 2019-10-10 LAB — HOMOCYSTEINE: Homocysteine: 9.1 umol/L (ref 0.0–17.2)

## 2019-10-10 LAB — METHYLMALONIC ACID, SERUM: Methylmalonic Acid: 134 nmol/L (ref 0–378)

## 2019-10-10 LAB — VITAMIN B1: Thiamine: 144.4 nmol/L (ref 66.5–200.0)

## 2019-10-10 LAB — B12 AND FOLATE PANEL
Folate: 9.8 ng/mL (ref >3.0)
Vitamin B-12: 397 pg/mL (ref 232–1245)

## 2019-10-10 LAB — TSH: TSH: 8.27 u[IU]/mL — ABNORMAL HIGH (ref 0.450–4.500)

## 2019-10-11 NOTE — Telephone Encounter (Signed)
Rep with [employer] called to check on the pts PA for the pts aimovig states the PA is what is holding up the case at the moment and once received they will move forward with review.   Cb#[phone] Fax#[phone]

## 2019-10-23 NOTE — Telephone Encounter (Signed)
Completed Aimovig PA on Cover My Meds. Key: [id number]. Awaiting determination from [facility] part D.

## 2019-10-24 NOTE — Telephone Encounter (Addendum)
Received denial from [employer]. Aimovig denied d/t pt not meeting prior auth requirements. Per letter: "(1) You have a trial and failure (after a trial of at least two months), contraindication, or intolerance to two of the following preventative therapies from the list below: (a) Amitriptyline (Elavil); (b) one of the following beta-blockers: Atenolol, metoprolol, nadolol, propranolol, or timolol; (c) divalproex sodium (Depakote/Depakote ER); (d) onabotulinumtoxinA (Botox) (for chronic migraine only); (e) topiramate (Topamax); (f) venlafaxine (Effexor)."  Patient is currently applying for assistance with [employer]. I faxed the PA determination letter to [employer] as required. Received a receipt of confirmation.  If we need to appeal the request, fax within 60 days to [phone]. Reference # [id number].

## 2019-12-08 ENCOUNTER — Other Ambulatory Visit: Payer: Self-pay

## 2019-12-08 ENCOUNTER — Ambulatory Visit
Admission: RE | Admit: 2019-12-08 | Discharge: 2019-12-08 | Disposition: A | Discharge status: Home / Self Care | Payer: 59 | Source: Ambulatory Visit | Attending: Neurology | Admitting: Neurology

## 2019-12-08 DIAGNOSIS — F09 Unspecified mental disorder due to known physiological condition: Secondary | ICD-10-CM

## 2019-12-08 DIAGNOSIS — R413 Other amnesia: Secondary | ICD-10-CM

## 2019-12-08 IMAGING — MR MR HEAD WO/W CM
12 series · 48 of 48 positions shown · IV contrast (multihance)
Comparison: MR head without contrast [DATE]

CLINICAL DATA: Memory loss. Migraine headaches. Progressive
symptoms over the last 15 years.

EXAM:
MRI HEAD WITHOUT AND WITH CONTRAST
TECHNIQUE: Multiplanar, multiecho pulse sequences of the brain and surrounding
structures were obtained without and with intravenous contrast.
CONTRAST:  13mL MULTIHANCE GADOBENATE DIMEGLUMINE 529 MG/ML IV SOLN

[Series 2: t1_se_sag · sagittal · 5.0mm · 0.45mm/px · 1 of 19 slices shown]
[im 1/19]
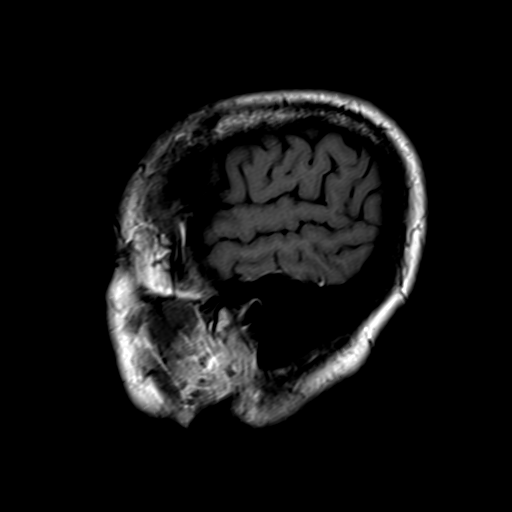

[Series 3: ep2d_diff_ · axial · 3.0mm · 1.80mm/px · z∈[-36,+107]mm · 7 of 98 slices shown]
[im 1/98]
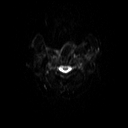
[im 17/98]
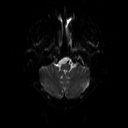
[im 33/98]
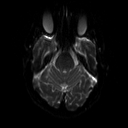
[im 49/98]
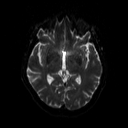
[im 65/98]
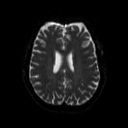
[im 81/98]
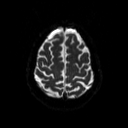
[im 98/98]
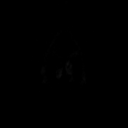

[Series 4: ep2d_diff__adc · axial · 3.0mm · 1.80mm/px · z∈[-36,+107]mm · 3 of 48 slices shown]
[im 1/48]
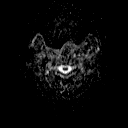
[im 24/48]
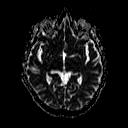
[im 48/48]
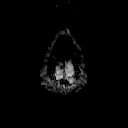

[Series 5: ep2d_diff_cor · coronal · 5.0mm · 1.77mm/px · 4 of 49 slices shown]
[im 1/49]
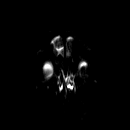
[im 17/49]
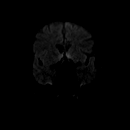
[im 33/49]
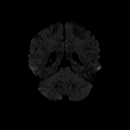
[im 49/49]
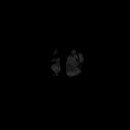

[Series 6: ep2d_diff_cor_adc · coronal · 5.0mm · 1.77mm/px · 2 of 25 slices shown]
[im 1/25]
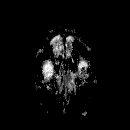
[im 25/25]
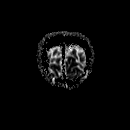

[Series 8: swi_images · axial · 4.0mm · 0.90mm/px · z∈[-34,+105]mm · 3 of 36 slices shown]
[im 1/36]
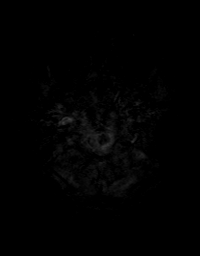
[im 18/36]
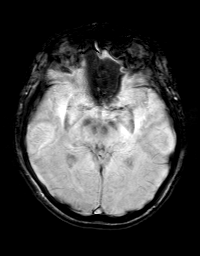
[im 36/36]
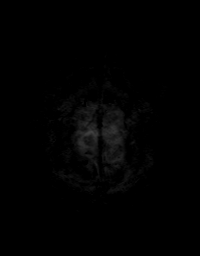

[Series 9: FLAIR · axial · 3.0mm · 0.43mm/px · z∈[-43,+112]mm · 2 of 27 slices shown]
[im 1/27]
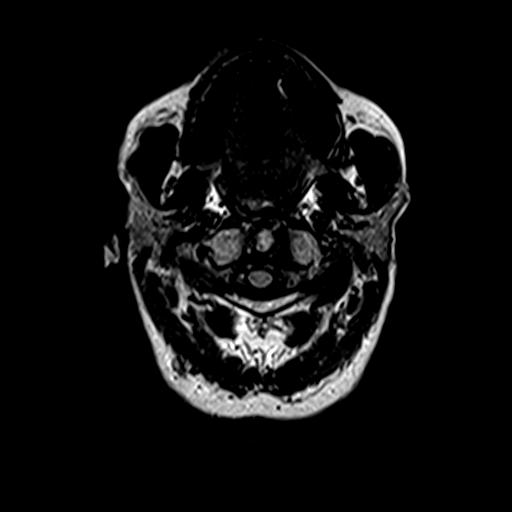
[im 27/27]
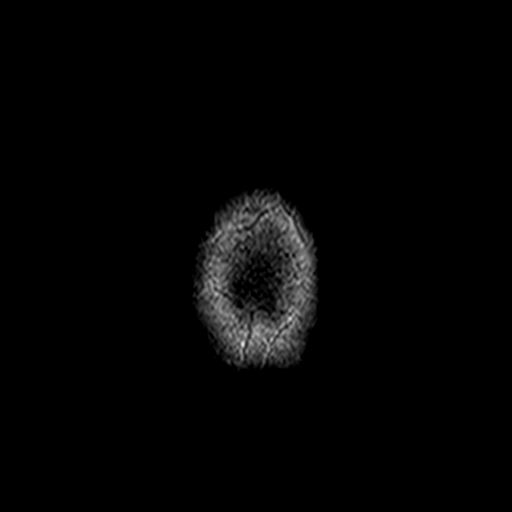

[Series 10: t1_mpr_tra · axial · 1.0mm · 0.72mm/px · z∈[-35,+107]mm · 10 of 144 slices shown (1 of 2)]
[im 1/144]
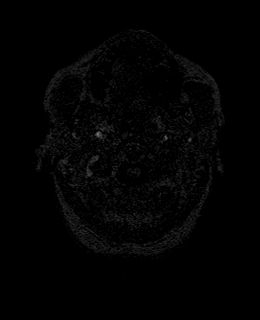
[im 16/144]
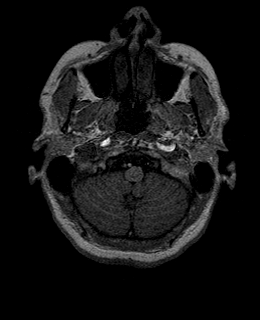
[im 32/144]
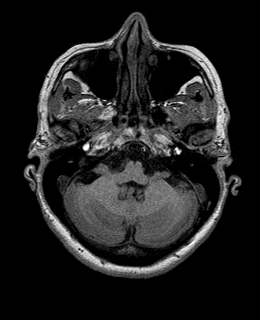
[im 48/144]
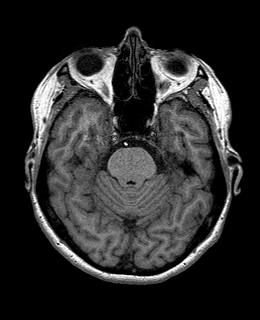
[im 64/144]
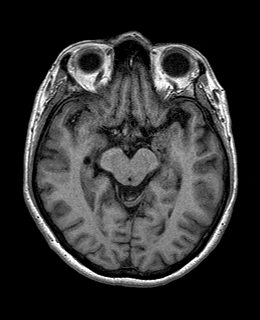
[im 80/144]
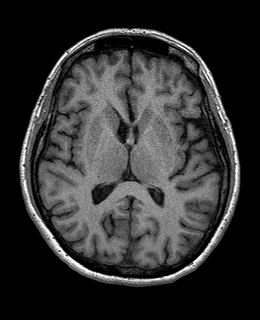
[im 96/144]
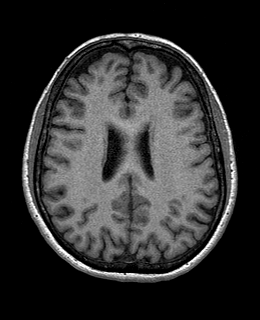
[im 112/144]
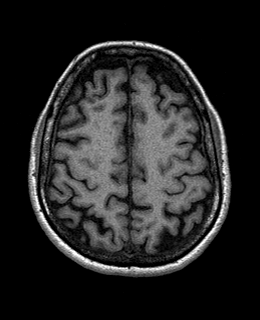
[im 128/144]
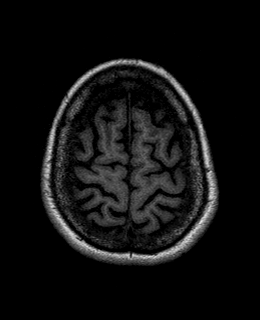
[im 144/144]
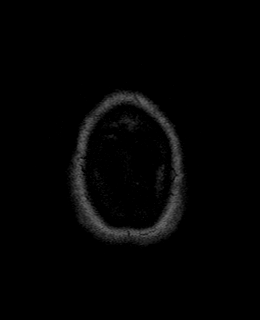

[Series 11: t2_tse_tra · axial · 5.0mm · 0.72mm/px · z∈[-39,+110]mm · 2 of 26 slices shown]
[im 1/26]
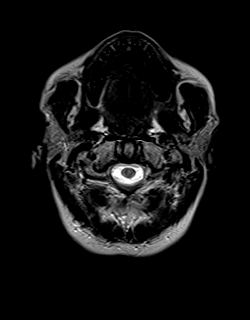
[im 26/26]
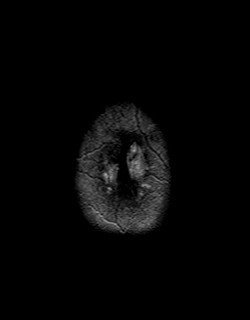

[Series 12: T2 · coronal · 5.0mm · 0.45mm/px · 2 of 26 slices shown]
[im 1/26]
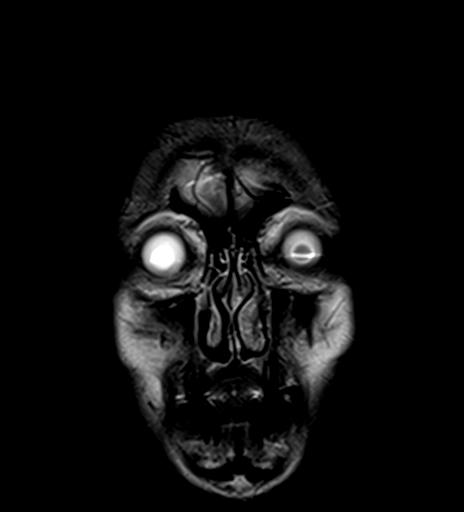
[im 26/26]
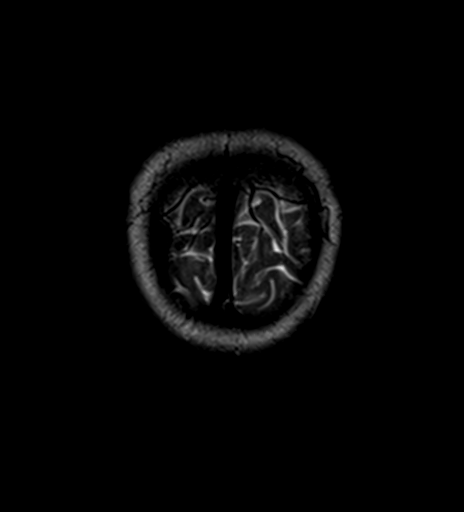

[Series 13: t1_mpr_tra · axial · 1.0mm · 0.72mm/px · z∈[-35,+107]mm · 10 of 144 slices shown (2 of 2)]
[im 1/144]
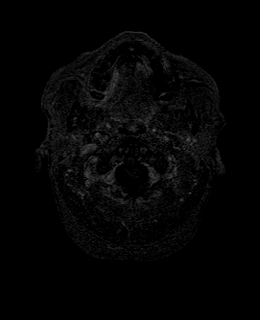
[im 16/144]
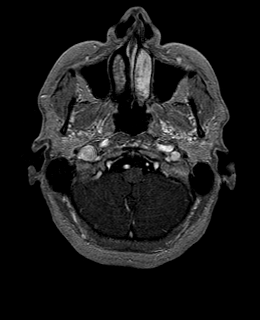
[im 32/144]
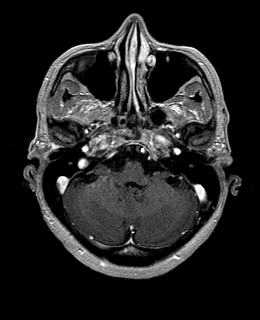
[im 48/144]
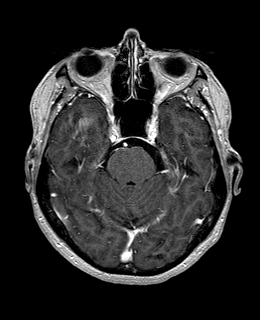
[im 64/144]
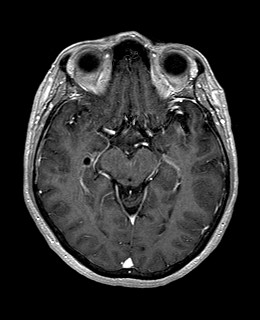
[im 80/144]
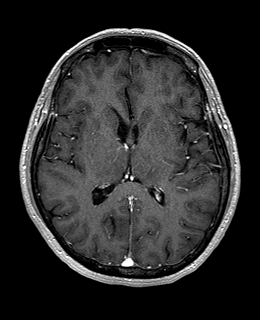
[im 96/144]
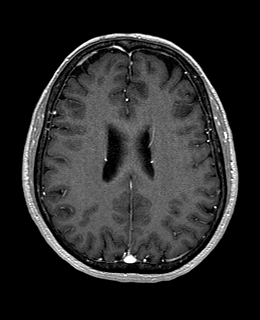
[im 112/144]
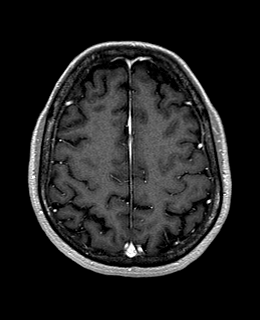
[im 128/144]
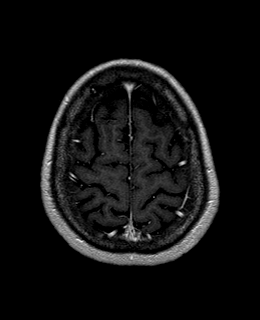
[im 144/144]
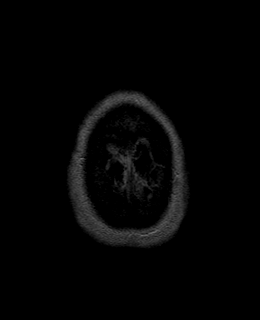

[Series 14: T1 post-contrast · coronal · 5.0mm · 0.72mm/px · 2 of 30 slices shown]
[im 1/30]
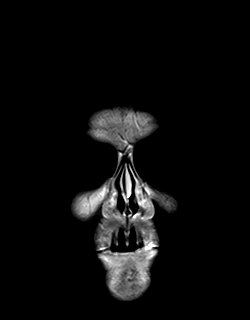
[im 30/30]
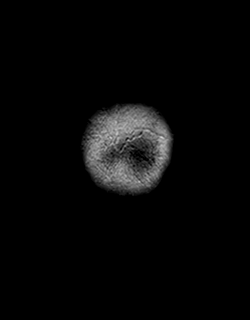

[48 of 48 positions shown; findings below may reference images not displayed]

FINDINGS: Brain: No acute infarct, hemorrhage, or mass lesion is present. No
significant white matter lesions are present. The ventricles are of
normal size. No significant extra-axial fluid collection is present.
No significant lobar atrophy is present.

The internal auditory canals are within normal limits. The brainstem
and cerebellum are within normal limits.

Vascular: Flow is present in the major intracranial arteries.

Skull and upper cervical spine: The craniocervical junction is
normal. Upper cervical spine is within normal limits. Marrow signal
is unremarkable.

Sinuses/Orbits: The paranasal sinuses and mastoid air cells are
clear. Right lens replacement is noted. Globes and orbits are
otherwise within normal limits.
IMPRESSION: Normal MRI of the brain for age. No acute or focal lesion. No
significant atrophy or finding to explain memory loss or headaches.

## 2019-12-08 MED ORDER — GADOBENATE DIMEGLUMINE 529 MG/ML IV SOLN
13.0000 mL | Freq: Once | INTRAVENOUS | Status: AC | PRN
  Administered 2019-12-08: 13 mL via INTRAVENOUS

## 2019-12-10 NOTE — Progress Notes (Signed)
IMPRESSION: Normal MRI of the brain for age. No acute or focal lesion. No significant atrophy or finding to explain memory loss or headaches.   Electronically Signed   By: San Morelle M.D.   On: 12/09/2019 07:10

## 2019-12-11 ENCOUNTER — Telehealth: Payer: Self-pay | Admitting: Neurology

## 2019-12-11 NOTE — Telephone Encounter (Signed)
-----   Message from Larey Seat, MD sent at 12/10/2019  1:01 PM EDT ----- IMPRESSION: Normal MRI of the brain for age. No acute or focal lesion. No significant atrophy or finding to explain memory loss or headaches.   Electronically Signed   By: San Morelle M.D.   On: 12/09/2019 07:10

## 2019-12-11 NOTE — Telephone Encounter (Signed)
Called the patient and discussed the normal MRI findings. Advised there was nothing that appeared concerning. Pt verbalized understanding. Pt had no questions at this time but was encouraged to call back if questions arise.

## 2020-04-07 ENCOUNTER — Telehealth (INDEPENDENT_AMBULATORY_CARE_PROVIDER_SITE_OTHER): Payer: Medicare Other | Admitting: Adult Health

## 2020-04-07 ENCOUNTER — Encounter: Payer: Self-pay | Admitting: Adult Health

## 2020-04-07 ENCOUNTER — Telehealth: Payer: Medicare Other | Admitting: Neurology

## 2020-04-07 DIAGNOSIS — R413 Other amnesia: Secondary | ICD-10-CM

## 2020-04-07 DIAGNOSIS — G43709 Chronic migraine without aura, not intractable, without status migrainosus: Secondary | ICD-10-CM | POA: Diagnosis not present

## 2020-04-07 NOTE — Addendum Note (Signed)
Addended by: Trudie Buckler on: 04/07/2020 11:42 AM   Modules accepted: Orders

## 2020-04-07 NOTE — Progress Notes (Signed)
  [facility] [address] [place]. [place] [address] [phone]     Virtual Visit via Telephone Note  I connected with Brooke Stuart on 04/07/20 at 11:00 AM EDT by telephone located remotely at [facility] and verified that I am speaking with the correct person using two identifiers who reports being located at home.   Visit scheduled by RN. She discussed the limitations, risks, security and privacy concerns of performing an evaluation and management service by telephone and the availability of in person appointments. I also discussed with the patient that there may be a patient responsible charge related to this service. The patient expressed understanding and agreed to proceed. See telephone note for consent and additional scheduling information.    History of Present Illness:  Brooke Stuart is a 69 y.o. female who has been followed in this office for migraines and memory. She was initially scheduled for face-to-face office follow up visit today time but due to [place], visit rescheduled for non-face-to-face telephone visit with patients consent. Unable to participate in video visit due to lack of access to device with camera.    Ms. Mccary reports that her headaches have been doing well with Aimovig.  She states essentially she is not had any migraine headaches.  If she gets a mild headache she will use Excedrin Migraine.  Reports that she is not had to use Maxalt.  She feels that her memory has remained stable.  She never got a call to schedule her neuropsychology evaluation.  She returns today for an evaluation.   Observations/Objective:  Generalized: Well developed, in no acute distress   Neurological examination  Mentation: Alert oriented to time, place, history taking. Follows all commands speech and language fluent  Assessment and Plan:  1: Migraine headaches  Continue Aimovig-denied through the savings plan.  We will continue with  samples for now.  2.  Memory disturbance  We will call and check on referral to neuropsychology and get her scheduled for an appointment   Follow Up Instructions:   F/U in February or sooner if needed    I discussed the assessment and treatment plan with the patient.  The patient was provided an opportunity to ask questions and all were answered to their satisfaction. The patient agreed with the plan and verbalized an understanding of the instructions.   I spent 20 minutes of telephone  and non-face-to-face time with patient.  This included previsit chart review, lab review, study review, order entry, electronic health record documentation, patient education.     Ward Givens NP-C  [facility] [address] [place], [place] [address]  Phone [phone] Fax [phone] \

## 2020-07-24 ENCOUNTER — Ambulatory Visit: Payer: Medicare Other | Admitting: Adult Health

## 2020-07-24 ENCOUNTER — Encounter: Payer: Self-pay | Admitting: Adult Health
